# Patient Record
Sex: Male | Born: 1985 | Hispanic: No | Marital: Married | Smoking: Never smoker
Health system: Southern US, Community
[De-identification: ages and names within clinical notes are randomized; demographics above are authoritative.]

---

## 2019-07-18 ENCOUNTER — Other Ambulatory Visit (HOSPITAL_BASED_OUTPATIENT_CLINIC_OR_DEPARTMENT_OTHER): Payer: Self-pay | Admitting: Internal Medicine

## 2019-07-18 DIAGNOSIS — I272 Pulmonary hypertension, unspecified: Secondary | ICD-10-CM

## 2019-07-19 ENCOUNTER — Other Ambulatory Visit: Payer: Self-pay

## 2019-07-19 ENCOUNTER — Ambulatory Visit (HOSPITAL_BASED_OUTPATIENT_CLINIC_OR_DEPARTMENT_OTHER)
Admission: RE | Admit: 2019-07-19 | Discharge: 2019-07-19 | Disposition: A | Discharge status: Home / Self Care | Payer: 59 | Source: Ambulatory Visit | Attending: Internal Medicine | Admitting: Internal Medicine

## 2019-07-19 DIAGNOSIS — I272 Pulmonary hypertension, unspecified: Secondary | ICD-10-CM | POA: Insufficient documentation

## 2019-07-19 MED ORDER — IOHEXOL 300 MG/ML  SOLN
100.0000 mL | Freq: Once | INTRAMUSCULAR | Status: AC | PRN
  Administered 2019-07-19: 18:00:00 80 mL via INTRAVENOUS

## 2019-11-14 ENCOUNTER — Telehealth: Payer: Self-pay | Admitting: Emergency Medicine

## 2019-11-14 ENCOUNTER — Ambulatory Visit: Payer: Self-pay

## 2019-11-14 ENCOUNTER — Emergency Department (HOSPITAL_BASED_OUTPATIENT_CLINIC_OR_DEPARTMENT_OTHER)
Admission: EM | Admit: 2019-11-14 | Discharge: 2019-11-14 | Disposition: A | Discharge status: Home / Self Care | Payer: 59 | Attending: Emergency Medicine | Admitting: Emergency Medicine

## 2019-11-14 ENCOUNTER — Other Ambulatory Visit: Payer: Self-pay

## 2019-11-14 ENCOUNTER — Emergency Department (HOSPITAL_BASED_OUTPATIENT_CLINIC_OR_DEPARTMENT_OTHER): Payer: 59

## 2019-11-14 ENCOUNTER — Encounter (HOSPITAL_BASED_OUTPATIENT_CLINIC_OR_DEPARTMENT_OTHER): Payer: Self-pay | Admitting: Emergency Medicine

## 2019-11-14 DIAGNOSIS — R000 Tachycardia, unspecified: Secondary | ICD-10-CM | POA: Diagnosis present

## 2019-11-14 DIAGNOSIS — R519 Headache, unspecified: Secondary | ICD-10-CM | POA: Diagnosis not present

## 2019-11-14 DIAGNOSIS — M542 Cervicalgia: Secondary | ICD-10-CM | POA: Diagnosis not present

## 2019-11-14 DIAGNOSIS — R002 Palpitations: Secondary | ICD-10-CM | POA: Insufficient documentation

## 2019-11-14 LAB — CBC WITH DIFFERENTIAL/PLATELET
Abs Immature Granulocytes: 0.02 10*3/uL (ref 0.00–0.07)
Basophils Absolute: 0 10*3/uL (ref 0.0–0.1)
Basophils Relative: 0 %
Eosinophils Absolute: 0 10*3/uL (ref 0.0–0.5)
Eosinophils Relative: 0 %
HCT: 45.1 % (ref 39.0–52.0)
Hemoglobin: 14.5 g/dL (ref 13.0–17.0)
Immature Granulocytes: 0 %
Lymphocytes Relative: 16 %
Lymphs Abs: 1.4 10*3/uL (ref 0.7–4.0)
MCH: 29.2 pg (ref 26.0–34.0)
MCHC: 32.2 g/dL (ref 30.0–36.0)
MCV: 90.7 fL (ref 80.0–100.0)
Monocytes Absolute: 0.3 10*3/uL (ref 0.1–1.0)
Monocytes Relative: 4 %
Neutro Abs: 7.4 10*3/uL (ref 1.7–7.7)
Neutrophils Relative %: 80 %
Platelets: 227 10*3/uL (ref 150–400)
RBC: 4.97 MIL/uL (ref 4.22–5.81)
RDW: 12.7 % (ref 11.5–15.5)
WBC: 9.2 10*3/uL (ref 4.0–10.5)
nRBC: 0 % (ref 0.0–0.2)

## 2019-11-14 LAB — BASIC METABOLIC PANEL
Anion gap: 8 (ref 5–15)
BUN: 15 mg/dL (ref 6–20)
CO2: 26 mmol/L (ref 22–32)
Calcium: 9.2 mg/dL (ref 8.9–10.3)
Chloride: 104 mmol/L (ref 98–111)
Creatinine, Ser: 0.72 mg/dL (ref 0.61–1.24)
GFR calc Af Amer: >60 mL/min (ref >60)
GFR calc non Af Amer: >60 mL/min (ref >60)
Glucose, Bld: 117 mg/dL — ABNORMAL HIGH (ref 70–99)
Potassium: 4 mmol/L (ref 3.5–5.1)
Sodium: 138 mmol/L (ref 135–145)

## 2019-11-14 MED ORDER — SODIUM CHLORIDE 0.9 % IV BOLUS
1000.0000 mL | Freq: Once | INTRAVENOUS | Status: AC
  Administered 2019-11-14: 1000 mL via INTRAVENOUS

## 2019-11-14 NOTE — ED Notes (Signed)
Pt on monitor 

## 2019-11-14 NOTE — Telephone Encounter (Signed)
Message left for pt to call back to Urgent Care [phone])  regarding symptoms. Pt called back during note, pt stated he had a heart rate of 110-130 w/ night sweats, low temp 97, neck pain and headache. Based on symptoms after discussing with provider, pt is going to ER. Appointment scheduled for today will be canceled by [facility] staff.

## 2019-11-14 NOTE — ED Triage Notes (Signed)
Pt c/o tachycardia and diaphoresis since last night. Also reports neck stiffness and "tension to head" for "quite some time".

## 2019-11-14 NOTE — ED Provider Notes (Signed)
[facility] EMERGENCY DEPARTMENT Provider Note   CSN: [id number] Arrival date & time: 11/14/19  7026     History Chief Complaint  Patient presents with  . Tachycardia    James Vincent is a 34 y.o. male.  Patient with no significant medical history here with fast heart rate, headaches, for the last several weeks on and off.  Has had a lot of neck pain and stiffness.  Works a Research officer, trade union and sitting down.  Denies any obvious anxiety or stress.  Denies any chest pain, shortness of breath, abdominal pain.  Has seen ophthalmologist and was told he might have astigmatism.  Denies any active visual problems or headache or neck pain at this time.  Mostly concerned because he had a fast heart rate at home.  The history is provided by the patient.  Illness Severity:  Mild Onset quality:  Gradual Timing:  Intermittent Progression:  Waxing and waning Chronicity:  New Associated symptoms: headaches   Associated symptoms: no abdominal pain, no chest pain, no cough, no ear pain, no fever, no rash, no shortness of breath, no sore throat and no vomiting        History reviewed. No pertinent past medical history.  There are no problems to display for this patient.   History reviewed. No pertinent surgical history.     No family history on file.  Social History   Tobacco Use  . Smoking status: Never Smoker  . Smokeless tobacco: Never Used  Substance Use Topics  . Alcohol use: Not Currently  . Drug use: Never    Home Medications Prior to Admission medications   Not on File    Allergies    Patient has no known allergies.  Review of Systems   Review of Systems  Constitutional: Negative for chills and fever.  HENT: Negative for ear pain and sore throat.   Eyes: Negative for pain and visual disturbance.  Respiratory: Negative for cough and shortness of breath.   Cardiovascular: Negative for chest pain and palpitations.  Gastrointestinal: Negative for  abdominal pain and vomiting.  Genitourinary: Negative for dysuria and hematuria.  Musculoskeletal: Positive for neck pain. Negative for arthralgias and back pain.  Skin: Negative for color change and rash.  Neurological: Positive for headaches. Negative for seizures and syncope.  All other systems reviewed and are negative.   Physical Exam Updated Vital Signs  ED Triage Vitals  Enc Vitals Group     BP 11/14/19 0936 125/85     Pulse Rate 11/14/19 0936 82     Resp 11/14/19 0936 20     Temp 11/14/19 0936 99 F (37.2 C)     Temp Source 11/14/19 0936 Oral     SpO2 11/14/19 0936 100 %     Weight 11/14/19 0934 152 lb 1.9 oz (69 kg)     Height 11/14/19 0934 6' (1.829 m)     Head Circumference --      Peak Flow --      Pain Score 11/14/19 0934 0     Pain Loc --      Pain Edu? --      Excl. in GC? --     Physical Exam Vitals and nursing note reviewed.  Constitutional:      General: He is not in acute distress.    Appearance: He is well-developed. He is not ill-appearing.  HENT:     Head: Normocephalic and atraumatic.     Nose: Nose normal.  Mouth/Throat:     Mouth: Mucous membranes are moist.  Eyes:     Extraocular Movements: Extraocular movements intact.     Conjunctiva/sclera: Conjunctivae normal.     Pupils: Pupils are equal, round, and reactive to light.  Cardiovascular:     Rate and Rhythm: Normal rate and regular rhythm.     Pulses: Normal pulses.     Heart sounds: Normal heart sounds. No murmur heard.   Pulmonary:     Effort: Pulmonary effort is normal. No respiratory distress.     Breath sounds: Normal breath sounds.  Abdominal:     General: Abdomen is flat.     Palpations: Abdomen is soft.     Tenderness: There is no abdominal tenderness.  Musculoskeletal:        General: No tenderness.     Cervical back: Normal range of motion and neck supple. No tenderness.     Comments: No midline spinal tenderness  Skin:    General: Skin is warm and dry.    Neurological:     General: No focal deficit present.     Mental Status: He is alert and oriented to person, place, and time.     Cranial Nerves: No cranial nerve deficit.     Sensory: No sensory deficit.     Motor: No weakness.     Coordination: Coordination normal.     Comments: 5+ out of 5 strength throughout, normal sensation, no drift, normal finger-nose-finger, normal speech     ED Results / Procedures / Treatments   Labs (all labs ordered are listed, but only abnormal results are displayed) Labs Reviewed  BASIC METABOLIC PANEL - Abnormal; Notable for the following components:      Result Value   Glucose, Bld 117 (*)    All other components within normal limits  CBC WITH DIFFERENTIAL/PLATELET    EKG EKG Interpretation  Date/Time:  Thursday November 14 2019 09:42:26 EDT Ventricular Rate:  86 PR Interval:    QRS Duration: 93 QT Interval:  370 QTC Calculation: 443 R Axis:   87 Text Interpretation: Sinus rhythm Consider right atrial enlargement Confirmed by Virgina Norfolk [id number]) on 11/14/2019 10:11:59 AM   Radiology DG Chest Portable 1 View  Result Date: 11/14/2019 CLINICAL DATA:  Tachycardia EXAM: PORTABLE CHEST 1 VIEW COMPARISON:  July 19, 2019 FINDINGS: The cardiomediastinal silhouette is normal in contour. No pleural effusion. No pneumothorax. No acute pleuroparenchymal abnormality. Unchanged apical pleural thickening. Visualized abdomen is unremarkable. No acute osseous abnormality noted. IMPRESSION: No acute cardiopulmonary abnormality. Electronically Signed   By: Meda Klinefelter MD   On: 11/14/2019 10:28    Procedures Procedures (including critical care time)  Medications Ordered in ED Medications  sodium chloride 0.9 % bolus 1,000 mL (1,000 mLs Intravenous New Bag/Given 11/14/19 1114)    ED Course  I have reviewed the triage vital signs and the nursing notes.  Pertinent labs & imaging results that were available during my care of the patient were  reviewed by me and considered in my medical decision making (see chart for details).    MDM Rules/Calculators/A&P                          James Vincent is a 34 year old male who presents to the ED with tachycardia.  Patient with normal vitals.  No fever.  Normal heart rate.  EKG shows sinus rhythm.  No ischemic changes.  Patient with no significant anemia, electrolyte abnormality, kidney injury.  Chest x-ray without any signs of infection.  No pneumothorax.  Patient mostly concerned about a fast heart rate at home but that has now resolved.  He has had intermittent headaches, neck pain over the last several weeks.  Sits a lot at a desk and thinks may be related to that and looking at a computer a lot for work.  He has no current headache or neck pain now.  I suspect he might have some anxiety.  Could be muscle related/tension type pain in head and the neck.  Recently saw an ophthalmologist who told him that he had astigmatism that may be causing some of his symptoms as well.  Recommend follow-up with ophthalmology primary care doctor.  Did offer a head CT to further evaluate for any intracranial process but after discussion we held off.  He has a normal neurological exam.  No concern for intracranial process.  However may consider MRI with primary care doctor if symptoms persist.  Understands return precautions.  Discharged in good condition.  This chart was dictated using voice recognition software.  Despite best efforts to proofread,  errors can occur which can change the documentation meaning.    Final Clinical Impression(s) / ED Diagnoses Final diagnoses:  Palpitations    Rx / DC Orders ED Discharge Orders    None       Virgina Norfolk, DO 11/14/19 1208

## 2019-12-02 ENCOUNTER — Other Ambulatory Visit (HOSPITAL_BASED_OUTPATIENT_CLINIC_OR_DEPARTMENT_OTHER): Payer: Self-pay | Admitting: Internal Medicine

## 2019-12-02 DIAGNOSIS — R519 Headache, unspecified: Secondary | ICD-10-CM

## 2019-12-02 DIAGNOSIS — H538 Other visual disturbances: Secondary | ICD-10-CM

## 2019-12-07 ENCOUNTER — Ambulatory Visit (HOSPITAL_BASED_OUTPATIENT_CLINIC_OR_DEPARTMENT_OTHER)
Admission: RE | Admit: 2019-12-07 | Discharge: 2019-12-07 | Disposition: A | Discharge status: Home / Self Care | Payer: 59 | Source: Ambulatory Visit | Attending: Internal Medicine | Admitting: Internal Medicine

## 2019-12-07 ENCOUNTER — Other Ambulatory Visit: Payer: Self-pay

## 2019-12-07 DIAGNOSIS — R519 Headache, unspecified: Secondary | ICD-10-CM | POA: Diagnosis present

## 2019-12-07 DIAGNOSIS — H538 Other visual disturbances: Secondary | ICD-10-CM | POA: Insufficient documentation

## 2019-12-07 MED ORDER — GADOBUTROL 1 MMOL/ML IV SOLN
7.0000 mL | Freq: Once | INTRAVENOUS | Status: AC | PRN
  Administered 2019-12-07: 7 mL via INTRAVENOUS

## 2019-12-19 ENCOUNTER — Other Ambulatory Visit (HOSPITAL_BASED_OUTPATIENT_CLINIC_OR_DEPARTMENT_OTHER): Payer: Self-pay | Admitting: Internal Medicine

## 2019-12-19 DIAGNOSIS — M5136 Other intervertebral disc degeneration, lumbar region: Secondary | ICD-10-CM

## 2019-12-23 ENCOUNTER — Other Ambulatory Visit (HOSPITAL_BASED_OUTPATIENT_CLINIC_OR_DEPARTMENT_OTHER): Payer: Self-pay | Admitting: Internal Medicine

## 2019-12-23 DIAGNOSIS — M542 Cervicalgia: Secondary | ICD-10-CM

## 2019-12-24 ENCOUNTER — Other Ambulatory Visit: Payer: Self-pay

## 2019-12-24 ENCOUNTER — Ambulatory Visit (HOSPITAL_BASED_OUTPATIENT_CLINIC_OR_DEPARTMENT_OTHER)
Admission: RE | Admit: 2019-12-24 | Discharge: 2019-12-24 | Disposition: A | Discharge status: Home / Self Care | Payer: 59 | Source: Ambulatory Visit | Attending: Internal Medicine | Admitting: Internal Medicine

## 2019-12-24 DIAGNOSIS — M542 Cervicalgia: Secondary | ICD-10-CM | POA: Insufficient documentation

## 2020-02-03 ENCOUNTER — Other Ambulatory Visit: Payer: Self-pay

## 2020-02-03 ENCOUNTER — Encounter: Payer: Self-pay | Admitting: Student

## 2020-02-03 ENCOUNTER — Ambulatory Visit (INDEPENDENT_AMBULATORY_CARE_PROVIDER_SITE_OTHER): Payer: 59 | Admitting: Student

## 2020-02-03 VITALS — BP 116/81 | HR 85 | Temp 98.2°F | Ht 72.0 in | Wt 160.0 lb

## 2020-02-03 DIAGNOSIS — Z23 Encounter for immunization: Secondary | ICD-10-CM | POA: Diagnosis not present

## 2020-02-03 DIAGNOSIS — G44209 Tension-type headache, unspecified, not intractable: Secondary | ICD-10-CM

## 2020-02-03 DIAGNOSIS — R2689 Other abnormalities of gait and mobility: Secondary | ICD-10-CM | POA: Diagnosis not present

## 2020-02-03 NOTE — Patient Instructions (Addendum)
Thank you, [name] for allowing [place] to provide your care today. Today we discussed neck stiffness and feeling off balanced.  It seems as though you are having tension headaches! These can be terribly debilitating, I recommend taking ibuprofen for any discomfort, a heating pad during the day to help relax those muscles, and I will be referring you to physical therapy as well  I have ordered the following labs for you:  Lab Orders  No laboratory test(s) ordered today     Referrals ordered today:    Referral Orders     Ambulatory referral to Physical Therapy   I have ordered the following medication/changed the following medications:   Stop the following medications: There are no discontinued medications.   Start the following medications: No orders of the defined types were placed in this encounter.    Follow up: 3 months    Remember: To take breaks at work and stretch your neck!  Should you have any questions or concerns please call the internal medicine clinic at [phone].     James Vincent, D.O. [facility]   Tension Headache, Adult A tension headache is pain, pressure, or aching in your head. Tension headaches can last from 30 minutes to several days. Follow these instructions at home: Managing pain  Take over-the-counter and prescription medicines only as told by your doctor.  When you have a headache, lie down in a dark, quiet room.  If told, put ice on your head and neck: ? Put ice in a plastic bag. ? Place a towel between your skin and the bag. ? Leave the ice on for 20 minutes, 2-3 times a day.  If told, put heat on the back of your neck. Do this as often as your doctor tells you to. Use the kind of heat that your doctor recommends, such as a moist heat pack or a heating pad. ? Place a towel between your skin and the heat. ? Leave the heat on for 20-30 minutes. ? Remove the heat if your skin turns bright  red. Eating and drinking  Eat meals on a regular schedule.  Watch how much alcohol you drink: ? If you are a woman and are not pregnant, do not drink more than 1 drink a day. ? If you are a man, do not drink more than 2 drinks a day.  Drink enough fluid to keep your pee (urine) pale yellow.  Do not use a lot of caffeine, or stop using caffeine. Lifestyle  Get enough sleep. Get 7-9 hours of sleep each night. Or get the amount of sleep that your doctor tells you to.  At bedtime, remove all electronic devices from your room. Examples of electronic devices are computers, phones, and tablets.  Find ways to lessen your stress. Some things that can lessen stress are: ? Exercise. ? Deep breathing. ? Yoga. ? Music. ? Positive thoughts.  Sit up straight. Do not tighten (tense) your muscles.  Do not use any products that have nicotine or tobacco in them, such as cigarettes and e-cigarettes. If you need help quitting, ask your doctor. General instructions   Keep all follow-up visits as told by your doctor. This is important.  Avoid things that can bring on headaches. Keep a journal to find out if certain things bring on headaches. For example, write down: ? What you eat and drink. ? How much sleep you get. ? Any change to your diet or medicines. Contact a doctor if:  Your headache does not get better.  Your headache comes back.  You have a headache and sounds, light, or smells bother you.  You feel sick to your stomach (nauseous) or you throw up (vomit).  Your stomach hurts. Get help right away if:  You suddenly get a very bad headache along with any of these: ? A stiff neck. ? Feeling sick to your stomach. ? Throwing up. ? Feeling weak. ? Trouble seeing. ? Feeling short of breath. ? A rash. ? Feeling unusually sleepy. ? Trouble speaking. ? Pain in your eye or ear. ? Trouble walking or balancing. ? Feeling like you will pass out (faint). ? Passing out. Summary  A  tension headache is pain, pressure, or aching in your head.  Tension headaches can last from 30 minutes to several days.  Lifestyle changes and medicines may help relieve pain. This information is not intended to replace advice given to you by your health care provider. Make sure you discuss any questions you have with your health care provider. Document Revised: 01/02/2019 Document Reviewed: 06/17/2016 Elsevier Patient Education  2020 [employer].

## 2020-02-03 NOTE — Assessment & Plan Note (Signed)
Assessment: Patient endorses loss of balance for the past 5-6 months. When walking, he feels as though he needs to brace himself to not fall over. He however denies ever having fallen. He notices that it is worse out in public, but still endorses feeling off balance at home. It is better in the morning and worsens throughout the day. The patient notes it has gotten to the point where he does not like to go out in public, having fear that he will fall and hurt himself. He denies ever having this before. Denies dizziness or feeling as though the room is spinning. Denies ear pain. Does endorse some lower extremity weakness and questions whether or not his vitamin D needs to be supplemented or if he has a B12 deficiency. Reassured patient that his electrolytes and hemoglobin were unremarkable in August, prior to these symptoms beginning, which makes me less suspicious that this is a electrolyte or vitamin deficiency. The last labs were in August, after his symptoms onset.   On physical exam, patient's gait is unremarkable, without swaying or appearing off balance. The strength in all of his extremities is 5/5. Unremarkable neurological examination  While the tension headache he was diagnosed with today may be contributing, I believe that there is some anxiety to his loss of balance as well as deconditioning from lack of physical activity due to this fear of falling. He stated that at times he would ask his wife to run errands as he was nervous about falling. I believe that physical therapy for his tension headache and to assist him in his balance/gait will be best for the patient. If he continues to feel this way and if physical therapy assess the patient and do not believe there is true loss of balance, will consider cognitive behavioral therapy. Negative brain MRI and unremarkable labs is reassuring as well as inconsistency in symptoms; worse out in public and better at home.   Plan: Physical therapy referral  in place

## 2020-02-03 NOTE — Assessment & Plan Note (Addendum)
Assessment: Patient endorses neck pain/top of head discomfort for the past 4-5 months. He was evaluated by another PCP who ordered neck x-ray, brain MRI that were unremarkable. He notes the pain is worse in the middle of the day and improves in the evening and in the mornings. He notes the pain is worse with movements. Has tried icy hot without relief. 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 He tries to take breaks, but denies performing any neck stretches. Denies applying heat or warmth to the area.   On examination the patient's neck is not tender to palpate. He appears to have some tightness with rotation, he does however have the ability to perform full range of motion without limitations. His neurological exam was unremarkable.   The patient's history, physical exam findings, and MRI brain/cervical x-ray lead me to believe that his symptoms are due to tension headaches from his screen time while working and lack of physical activity/stretching. The patient has been cautious about the amount of physical activity he has been performing as he has felt off balanced. He appears nervous about falling and his hesitant to start any type of physical activity. Because of this, I believe conservative treatment and physical therapy referral would be the best course of action.  Plan: -Referral placed for physical therapy -Advised patient to take ibuprofen for any discomfort and purchase heating pad to use during the work day -Printed patient off home stretches in interim while he waits for PT referral

## 2020-02-03 NOTE — Progress Notes (Signed)
CC: Neck Stiffness  HPI:  [name] is a 34 y.o. male with a past medical history stated below and presents today for neck stiffness. Please see problem based assessment and plan for additional details.  No past medical history on file.  No current outpatient medications on file prior to visit.   No current facility-administered medications on file prior to visit.    No family history on file.  Social History   Socioeconomic History  . Marital status: Married    Spouse name: Not on file  . Number of children: Not on file  . Years of education: Not on file  . Highest education level: Not on file  Occupational History  . Not on file  Tobacco Use  . Smoking status: Never Smoker  . Smokeless tobacco: Never Used  Substance and Sexual Activity  . Alcohol use: Not Currently  . Drug use: Never  . Sexual activity: Not on file  Other Topics Concern  . Not on file  Social History Narrative  . Not on file   Social Determinants of Health   Financial Resource Strain:   . Difficulty of Paying Living Expenses: Not on file  Food Insecurity:   . Worried About Programme researcher, broadcasting/film/video in the Last Year: Not on file  . Ran Out of Food in the Last Year: Not on file  Transportation Needs:   . Lack of Transportation (Medical): Not on file  . Lack of Transportation (Non-Medical): Not on file  Physical Activity:   . Days of Exercise per Week: Not on file  . Minutes of Exercise per Session: Not on file  Stress:   . Feeling of Stress : Not on file  Social Connections:   . Frequency of Communication with Friends and Family: Not on file  . Frequency of Social Gatherings with Friends and Family: Not on file  . Attends Religious Services: Not on file  . Active Member of Clubs or Organizations: Not on file  . Attends Banker Meetings: Not on file  . Marital Status: Not on file  Intimate Partner Violence:   . Fear of Current or Ex-Partner: Not on file  . Emotionally  Abused: Not on file  . Physically Abused: Not on file  . Sexually Abused: Not on file    Review of Systems: ROS negative except for what is noted on the assessment and plan.  Vitals:   02/03/20 1020  BP: 116/81  Pulse: 85  Temp: 98.2 F (36.8 C)  TempSrc: Oral  Weight: 160 lb (72.6 kg)  Height: 6' (1.829 m)     Physical Exam: Physical Exam Vitals and nursing note reviewed.  Constitutional:      General: He is not in acute distress.    Appearance: Normal appearance. He is normal weight. He is not ill-appearing, toxic-appearing or diaphoretic.  HENT:     Head: Normocephalic and atraumatic.  Eyes:     Pupils: Pupils are equal, round, and reactive to light.  Cardiovascular:     Rate and Rhythm: Normal rate and regular rhythm.     Pulses: Normal pulses.     Heart sounds: Normal heart sounds. No murmur heard.  No friction rub. No gallop.   Pulmonary:     Effort: Pulmonary effort is normal. No respiratory distress.     Breath sounds: Normal breath sounds. No stridor. No wheezing, rhonchi or rales.  Abdominal:     General: There is no distension.  Musculoskeletal:  Cervical back: Normal range of motion and neck supple. No rigidity or tenderness.     Right lower leg: No edema.     Left lower leg: No edema.  Lymphadenopathy:     Cervical: No cervical adenopathy.  Skin:    General: Skin is warm and dry.  Neurological:     General: No focal deficit present.     Mental Status: He is alert and oriented to person, place, and time. Mental status is at baseline.     Cranial Nerves: No cranial nerve deficit.     Sensory: No sensory deficit.     Motor: No weakness.     Coordination: Coordination normal.     Gait: Gait normal.  Psychiatric:        Mood and Affect: Mood normal.        Behavior: Behavior normal.        Thought Content: Thought content normal.        Judgment: Judgment normal.      Assessment & Plan:   See Encounters Tab for problem based  charting.  Patient seen with Dr. Jolly Mango, D.O. [facility], PGY-1 Pager: [phone], Phone: [phone] Date 02/03/2020 Time 5:35 PM

## 2020-02-11 ENCOUNTER — Encounter: Payer: Self-pay | Admitting: Physical Therapy

## 2020-02-11 ENCOUNTER — Ambulatory Visit: Payer: 59 | Attending: Internal Medicine | Admitting: Physical Therapy

## 2020-02-11 ENCOUNTER — Other Ambulatory Visit: Payer: Self-pay

## 2020-02-11 DIAGNOSIS — M6281 Muscle weakness (generalized): Secondary | ICD-10-CM | POA: Insufficient documentation

## 2020-02-11 DIAGNOSIS — M542 Cervicalgia: Secondary | ICD-10-CM | POA: Diagnosis not present

## 2020-02-11 DIAGNOSIS — R293 Abnormal posture: Secondary | ICD-10-CM

## 2020-02-11 NOTE — Patient Instructions (Signed)
Access Code: [email or web address] URL: [email or web address] Date: 02/11/2020 Prepared by: Rosana Hoes  Exercises Seated Passive Cervical Retraction - 3-4 x daily - 7 x weekly - 10 reps - 5 seconds hold Banded Row - 1 x daily - 7 x weekly - 3 sets - 15 reps Shoulder External Rotation and Scapular Retraction with Resistance - 1 x daily - 7 x weekly - 3 sets - 15 reps

## 2020-02-11 NOTE — Therapy (Signed)
[facility] [address] [place], [place], [address] Phone: [phone]   Fax:  [phone]  Physical Therapy Evaluation  Patient Details  Name: Astor Gentle MRN: [id number] Date of Birth: 1985-11-21 Referring Provider (PT): Anne Shutter, MD   Encounter Date: 02/11/2020   PT End of Session - 02/11/20 1320    Visit Number 1    Number of Visits 8    Date for PT Re-Evaluation 04/07/20    Authorization Type UHC    PT Start Time 1130    PT Stop Time 1215    PT Time Calculation (min) 45 min    Activity Tolerance Patient tolerated treatment well    Behavior During Therapy [facility] for tasks assessed/performed           History reviewed. No pertinent past medical history.  History reviewed. No pertinent surgical history.  There were no vitals filed for this visit.    Subjective Assessment - 02/11/20 1132    Subjective Patient reports symptoms of stiff neck, pressure on top of his head, and off balance for the past 4-5 months. He reports he did have an episode where he hurt his back and had to rest for 2-3 days, and that may have triggered his current symptoms. Sometimes he has times where symptoms are better or worse. 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 Patient notes symptoms increase when he goes outside like to grocery stores, and they get worse toward the evening, where he feels better in the morning. Patient notes his leg don't feel strong. Sometimes when he goes for walks, if he keeps his neck straight and doesn't look around he feels more stable. Patient also notes left shoulder/upper arm pain for past 3 weeks.    Limitations Sitting;Standing;Walking;House hold activities    How long can you sit comfortably? 30-40 minutes    How long can you walk comfortably? 4000 steps per day    Diagnostic tests Cervical X-ray, Brain MRI    Patient Stated Goals  Improve posture with sitting and feeling of balance and strength with walking    Currently in Pain? No/denies    Pain Score 0-No pain    Pain Location Neck    Pain Orientation Right;Left    Pain Descriptors / Indicators Tightness;Pressure    Pain Type Chronic pain    Pain Radiating Towards Pressure on top of head (always occurs with neck sitffness)    Pain Onset More than a month ago    Pain Frequency Intermittent    Aggravating Factors  Sitting extended periods, turning hed    Pain Relieving Factors If he keeps his head straight while walking, postural changes    Effect of Pain on Daily Activities Patient limited with sitting at work and walking in community              [facility] PT Assessment - 02/11/20 0001      Assessment   Medical Diagnosis Tension headache    Referring Provider (PT) Anne Shutter, MD    Onset Date/Surgical Date --   4-5 month history   Hand Dominance Right    Next MD Visit Not scheduled    Prior Therapy None      Precautions   Precautions None      Restrictions   Weight Bearing Restrictions No      Balance Screen   Has the patient fallen in  the past 6 months No    Has the patient had a decrease in activity level because of a fear of falling?  Yes    Is the patient reluctant to leave their home because of a fear of falling?  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    Leisure None currently reported      Cognition   Overall Cognitive Status Within Functional Limits for tasks assessed      Observation/Other Assessments   Observations Patient appears in no apparent distress    Focus on Therapeutic Outcomes (FOTO)  59% functional status      Sensation   Light Touch Appears Intact      Coordination   Gross Motor Movements are Fluid and Coordinated Yes      Posture/Postural Control   Posture  Comments Patient exhibits rounded shoulder and forward posture, flattening of thoracic spine      ROM / Strength   AROM / PROM / Strength AROM;Strength      AROM   Overall AROM Comments Tension noted with cervical rotation, patient exhibits limitation with left shoulder abduction and functiional reach HBB and HBH    AROM Assessment Site Cervical    Cervical Flexion 45    Cervical Extension 40    Cervical - Right Side Bend 20    Cervical - Left Side Bend 30    Cervical - Right Rotation 75    Cervical - Left Rotation 75      Strength   Overall Strength Comments Periscapular strength grossly 4-/5 MMT    Strength Assessment Site Shoulder    Right/Left Shoulder Right;Left    Right Shoulder Flexion 4+/5    Right Shoulder ABduction 4+/5    Right Shoulder External Rotation 4+/5    Left Shoulder Flexion 4+/5    Left Shoulder ABduction 4+/5    Left Shoulder External Rotation 4/5      Palpation   Spinal mobility Cervical spine WFL and no concordant pain elicited    Palpation comment Non TTP      Special Tests   Other special tests All radicular testing negative      Transfers   Transfers Independent with all Transfers                      Objective measurements completed on examination: See above findings.       [facility] Adult PT Treatment/Exercise - 02/11/20 0001      Exercises   Exercises Neck      Neck Exercises: Theraband   Rows 15 reps;Red    Shoulder External Rotation 15 reps;Red    Shoulder External Rotation Limitations double er + scap retraction      Neck Exercises: Seated   Neck Retraction 10 reps;5 secs    Neck Retraction Limitations used hand on chin for assist      Neck Exercises: Stretches   Lower Cervical/Upper Thoracic Stretch Limitations Trialed child's pose and sidelying thoracic stretch but patient reported left shoulder discomfort                  PT Education - 02/11/20 1320    Education Details Exam findings, posture, HEP, POC     Person(s) Educated Patient    Methods Explanation;Demonstration;Tactile cues;Verbal cues;Handout    Comprehension Verbalized understanding;Returned  demonstration;Verbal cues required;Tactile cues required;Need further instruction            PT Short Term Goals - 02/11/20 1321      PT SHORT TERM GOAL #1   Title Patient will be I with initial HEP to progress with PT    Time 4    Period Weeks    Status New    Target Date 03/10/20      PT SHORT TERM GOAL #2   Title PT with review FOTO with patient by 3rd visit    Time 4    Period Weeks    Status New    Target Date 03/10/20             PT Long Term Goals - 02/11/20 1323      PT LONG TERM GOAL #1   Title Patient will be I with final HEP to maintain progress from PT    Time 8    Period Weeks    Status New    Target Date 04/07/20      PT LONG TERM GOAL #2   Title Patient will report improved functional status to >/= 74% on FOTO    Time 8    Period Weeks    Status New    Target Date 04/07/20      PT LONG TERM GOAL #3   Title Patient will report no tightness or pain with cervical rotation to improve ability to turn head while driving    Time 8    Period Weeks    Status New    Target Date 04/07/20      PT LONG TERM GOAL #4   Title Patient will exhibit improved periscapular muscle strenth to >/= 4+/5 MMT to improve postural control and sitting tolerance    Time 8    Period Weeks    Status New    Target Date 04/07/20      PT LONG TERM GOAL #5   Title Patient will demonstrate improve DNF endurance to >/= 30 sec to improve postural control and reduce neck stiffness and head pressure    Time 8    Period Weeks    Status New    Target Date 04/07/20      Additional Long Term Goals   Additional Long Term Goals Yes      PT LONG TERM GOAL #6   Title Patient will report no instances of inbalance with walking in community to improve ability to exercise    Time 8    Period Weeks    Status New    Target Date  04/07/20                  Plan - 02/11/20 1333    Clinical Impression Statement Patient presents to PT with report of chronic neck stiffness and pressure on the top of his head. He also notes feeling of balance deficit and weakness when walking in community. His neck symptoms seem musculoskeletal in nature as he demonstrates slight limitation in cervical motion with report of muscular tightness, postural deficits with decreased periscapular strength, poor DNF endurance, and no radicular symptoms. He did not have any positive vestibular or occulomotor testing this visit to casue his feel of balance impairment. Will further assess balance in future visit. Patient was provided exercises to initiate postural control and he would benefit from continued skilled PT to progress his strength and mobility in order to reduce pain or limitation and maximize functional ability.  Personal Factors and Comorbidities Fitness;Time since onset of injury/illness/exacerbation    Examination-Activity Limitations Locomotion Level;Sit    Examination-Participation Restrictions Occupation;Community Activity;Shop    Stability/Clinical Decision Making Stable/Uncomplicated    Clinical Decision Making Low    Rehab Potential Good    PT Frequency 1x / week    PT Duration 8 weeks    PT Treatment/Interventions ADLs/Self Care Home Management;Cryotherapy;Electrical Stimulation;Iontophoresis 4mg /ml Dexamethasone;Moist Heat;Traction;Ultrasound;Neuromuscular re-education;Balance training;Therapeutic exercise;Therapeutic activities;Functional mobility training;Stair training;Gait training;Patient/family education;Manual techniques;Dry needling;Passive range of motion;Taping;Spinal Manipulations;Joint Manipulations    PT Next Visit Plan Review HEP and progress PRN, continue postural strengthening, incorporate thoracic mobility and core strengthening; further balance assessment and exercise PRN    PT Home Exercise Plan [id number]:  chin tuck with assist, row, er + scap retraction with band    Consulted and Agree with Plan of Care Patient           Patient will benefit from skilled therapeutic intervention in order to improve the following deficits and impairments:  Decreased range of motion, Postural dysfunction, Decreased strength, Pain, Decreased activity tolerance, Decreased balance  Visit Diagnosis: Cervicalgia  Abnormal posture  Muscle weakness (generalized)     Problem List Patient Active Problem List   Diagnosis Date Noted  . Tension headache 02/03/2020  . Loss of balance 02/03/2020    Rosana Hoesampbell Jameca Chumley, PT, DPT, LAT, ATC 02/11/20  1:47 PM Phone: [phone] Fax: [phone]   [facility] [address] [place], [place], [address] Phone: [phone]   Fax:  [phone]  Name: Wilford Gristhmet Lundblad MRN: [id number] Date of Birth: 12/02/1985

## 2020-02-15 NOTE — Progress Notes (Signed)
Internal Medicine Clinic Attending  I saw and evaluated the patient.  I personally confirmed the key portions of the history and exam documented by Dr. Katsadouros and I reviewed pertinent patient test results.  The assessment, diagnosis, and plan were formulated together and I agree with the documentation in the resident's note.  Donya Hitch, M.D., Ph.D.  

## 2020-02-21 ENCOUNTER — Ambulatory Visit: Payer: 59 | Attending: Internal Medicine | Admitting: Physical Therapy

## 2020-02-21 ENCOUNTER — Encounter: Payer: Self-pay | Admitting: Physical Therapy

## 2020-02-21 ENCOUNTER — Other Ambulatory Visit: Payer: Self-pay

## 2020-02-21 DIAGNOSIS — R293 Abnormal posture: Secondary | ICD-10-CM | POA: Diagnosis present

## 2020-02-21 DIAGNOSIS — M542 Cervicalgia: Secondary | ICD-10-CM | POA: Diagnosis not present

## 2020-02-21 DIAGNOSIS — M6281 Muscle weakness (generalized): Secondary | ICD-10-CM | POA: Insufficient documentation

## 2020-02-21 NOTE — Therapy (Signed)
[facility] [address] [place], [place], [address] Phone: [phone]   Fax:  [phone]  Physical Therapy Treatment  Patient Details  Name: James Vincent MRN: [id number] Date of Birth: 05-24-1985 Referring Provider (PT): Anne Shutter, MD   Encounter Date: 02/21/2020   PT End of Session - 02/21/20 1019    Visit Number 2    Number of Visits 8    Date for PT Re-Evaluation 04/07/20    Authorization Type UHC    PT Start Time 1019    PT Stop Time 1100    PT Time Calculation (min) 41 min           History reviewed. No pertinent past medical history.  History reviewed. No pertinent surgical history.  There were no vitals filed for this visit.   Subjective Assessment - 02/21/20 1019    Subjective No pain now. Doing the exercises. Maybe a little better.    Currently in Pain? No/denies              [facility] PT Assessment - 02/21/20 0001      Standardized Balance Assessment   Standardized Balance Assessment Berg Balance Test      Berg Balance Test   Sit to Stand Able to stand without using hands and stabilize independently    Standing Unsupported Able to stand safely 2 minutes    Sitting with Back Unsupported but Feet Supported on Floor or Stool Able to sit safely and securely 2 minutes    Stand to Sit Sits safely with minimal use of hands    Transfers Able to transfer safely, minor use of hands    Standing Unsupported with Eyes Closed Able to stand 10 seconds safely    Standing Unsupported with Feet Together Able to place feet together independently and stand 1 minute safely    From Standing, Reach Forward with Outstretched Arm Can reach confidently >25 cm (10")    From Standing Position, Pick up Object from Floor Able to pick up shoe safely and easily    From Standing Position, Turn to Look Behind Over each Shoulder Looks behind from both sides and weight shifts well    Turn 360 Degrees Able to turn 360  degrees safely in 4 seconds or less    Standing Unsupported, Alternately Place Feet on Step/Stool Able to stand independently and safely and complete 8 steps in 20 seconds    Standing Unsupported, One Foot in Front Able to place foot tandem independently and hold 30 seconds    Standing on One Leg Able to lift leg independently and hold > 10 seconds    Total Score 56                         [facility] Adult PT Treatment/Exercise - 02/21/20 0001      Neck Exercises: Machines for Strengthening   UBE (Upper Arm Bike) L2 posterior with cues for posture      Neck Exercises: Theraband   Rows 20 reps    Rows Limitations green    Shoulder External Rotation 20 reps    Shoulder External Rotation Limitations double er+scap retraction      Neck Exercises: Seated   Neck Retraction 10 reps;5 secs      Neck Exercises: Stretches   Lower Cervical/Upper Thoracic Stretch Limitations open book with hand on chest to reduce shoulder pain    Other Neck Stretches Seated thoracic extension in chair hands behind  head and chin tucked- , also thoracic rotation using chair for assist.                     PT Short Term Goals - 02/11/20 1321      PT SHORT TERM GOAL #1   Title Patient will be I with initial HEP to progress with PT    Time 4    Period Weeks    Status New    Target Date 03/10/20      PT SHORT TERM GOAL #2   Title PT with review FOTO with patient by 3rd visit    Time 4    Period Weeks    Status New    Target Date 03/10/20             PT Long Term Goals - 02/11/20 1323      PT LONG TERM GOAL #1   Title Patient will be I with final HEP to maintain progress from PT    Time 8    Period Weeks    Status New    Target Date 04/07/20      PT LONG TERM GOAL #2   Title Patient will report improved functional status to >/= 74% on FOTO    Time 8    Period Weeks    Status New    Target Date 04/07/20      PT LONG TERM GOAL #3   Title Patient will report no tightness  or pain with cervical rotation to improve ability to turn head while driving    Time 8    Period Weeks    Status New    Target Date 04/07/20      PT LONG TERM GOAL #4   Title Patient will exhibit improved periscapular muscle strenth to >/= 4+/5 MMT to improve postural control and sitting tolerance    Time 8    Period Weeks    Status New    Target Date 04/07/20      PT LONG TERM GOAL #5   Title Patient will demonstrate improve DNF endurance to >/= 30 sec to improve postural control and reduce neck stiffness and head pressure    Time 8    Period Weeks    Status New    Target Date 04/07/20      Additional Long Term Goals   Additional Long Term Goals Yes      PT LONG TERM GOAL #6   Title Patient will report no instances of inbalance with walking in community to improve ability to exercise    Time 8    Period Weeks    Status New    Target Date 04/07/20                 Plan - 02/21/20 1116    Clinical Impression Statement Pt reports maybe a little better and he is compliant with HEP. BERG 56/56, SLS 20 sec right, 17 sec left. Reviewed HEP and progressed with thoracic rotation and extension stretches. Added upper trap and levaor stretches all with good tolerance.    PT Next Visit Plan Review HEP and progress PRN, continue postural strengthening, incorporate thoracic mobility and core strengthening; further balance assessment and exercise PRN    PT Home Exercise Plan [id number]: chin tuck with assist, row, er + scap retraction with band, added upper trap stretch, levaor stretch, thoracic extension seated and open books (modified left hand on chest)  Patient will benefit from skilled therapeutic intervention in order to improve the following deficits and impairments:  Decreased range of motion, Postural dysfunction, Decreased strength, Pain, Decreased activity tolerance, Decreased balance  Visit Diagnosis: Cervicalgia  Abnormal posture  Muscle weakness  (generalized)     Problem List Patient Active Problem List   Diagnosis Date Noted   Tension headache 02/03/2020   Loss of balance 02/03/2020    Sherrie Mustache , PTA 02/21/2020, 11:20 AM  [facility] [address] [place], [place], [address] Phone: [phone]   Fax:  [phone]  Name: James Vincent MRN: [id number] Date of Birth: 28-Sep-1985

## 2020-02-28 ENCOUNTER — Other Ambulatory Visit: Payer: Self-pay

## 2020-02-28 ENCOUNTER — Encounter: Payer: Self-pay | Admitting: Physical Therapy

## 2020-02-28 ENCOUNTER — Ambulatory Visit: Payer: 59 | Admitting: Physical Therapy

## 2020-02-28 DIAGNOSIS — M6281 Muscle weakness (generalized): Secondary | ICD-10-CM

## 2020-02-28 DIAGNOSIS — R293 Abnormal posture: Secondary | ICD-10-CM

## 2020-02-28 DIAGNOSIS — M542 Cervicalgia: Secondary | ICD-10-CM

## 2020-02-28 NOTE — Therapy (Signed)
[place], [place], [address] Phone: [phone]   Fax:  [phone]  Physical Therapy Treatment  Patient Details  Name: Karsyn Rochin MRN: [id number] Date of Birth: 04-29-1985 Referring Provider (PT): Oda Kilts, MD   Encounter Date: 02/28/2020   PT End of Session - 02/28/20 1033    Visit Number 2    Number of Visits 8    Date for PT Re-Evaluation 04/07/20    Authorization Type UHC    PT Start Time 1017    PT Stop Time 1057    PT Time Calculation (min) 40 min           History reviewed. No pertinent past medical history.  History reviewed. No pertinent surgical history.  There were no vitals filed for this visit.   Subjective Assessment - 02/28/20 1018    Subjective Pt reports compliance with HEP and continued symptoms but they are not as intense. He feels improvement.    Currently in Pain? No/denies              [facility] PT Assessment - 02/28/20 0001      AROM   Cervical Flexion 45    Cervical Extension 45    Cervical - Right Side Bend 35    Cervical - Left Side Bend 30    Cervical - Right Rotation 75    Cervical - Left Rotation 75      Strength   Left Shoulder ABduction 4+/5    Left Shoulder External Rotation 4/5                         [facility] Adult PT Treatment/Exercise - 02/28/20 0001      Neck Exercises: Theraband   Shoulder Extension 10 reps    Shoulder Extension Limitations blue    Rows 20 reps    Rows Limitations blue    Shoulder External Rotation 20 reps;Green    Shoulder External Rotation Limitations double er+scap retraction    Horizontal ABduction 10 reps;Green    Horizontal ABduction Limitations 2 sets      Neck Exercises: Standing   Other Standing Exercises lower trap setting -bilat wall slides with lift off x 10      Neck Exercises: Seated   Neck Retraction 10 reps;5 secs      Neck Exercises: Stretches   Upper Trapezius Stretch 3  reps   added hand for over pressure   Levator Stretch 3 reps   added hand for overpressure   Other Neck Stretches seated thoracic extension strech 10 sec x 10                  PT Education - 02/28/20 1051    Education Details FOTO score and prediction reviewed with patient, HEP    Person(s) Educated Patient    Methods Explanation;Handout    Comprehension Verbalized understanding            PT Short Term Goals - 02/28/20 1050      PT SHORT TERM GOAL #1   Title Patient will be I with initial HEP to progress with PT    Time 4    Period Weeks    Status Achieved    Target Date 03/10/20      PT SHORT TERM GOAL #2   Title PT with review FOTO with patient by 3rd visit    Time 4    Period Weeks  Status Achieved             PT Long Term Goals - 02/11/20 1323      PT LONG TERM GOAL #1   Title Patient will be I with final HEP to maintain progress from PT    Time 8    Period Weeks    Status New    Target Date 04/07/20      PT LONG TERM GOAL #2   Title Patient will report improved functional status to >/= 74% on FOTO    Time 8    Period Weeks    Status New    Target Date 04/07/20      PT LONG TERM GOAL #3   Title Patient will report no tightness or pain with cervical rotation to improve ability to turn head while driving    Time 8    Period Weeks    Status New    Target Date 04/07/20      PT LONG TERM GOAL #4   Title Patient will exhibit improved periscapular muscle strenth to >/= 4+/5 MMT to improve postural control and sitting tolerance    Time 8    Period Weeks    Status New    Target Date 04/07/20      PT LONG TERM GOAL #5   Title Patient will demonstrate improve DNF endurance to >/= 30 sec to improve postural control and reduce neck stiffness and head pressure    Time 8    Period Weeks    Status New    Target Date 04/07/20      Additional Long Term Goals   Additional Long Term Goals Yes      PT LONG TERM GOAL #6   Title Patient will report  no instances of inbalance with walking in community to improve ability to exercise    Time 8    Period Weeks    Status New    Target Date 04/07/20                 Plan - 02/28/20 1019    Clinical Impression Statement Pt repots intermittent pulling in back of neck with cervical rotation and pressure that builds up in top of head 3-4 times a week. He reports difficulty reaching laterally to reach ATM. He reports improved comfort with looking around while walking, he feels more balanced. Cervical ROM improved.  Session focused on review and  progressing resistance with current HEP. Also added standing scap strength. He tolerated session well. Reviewed FOTO predictions with pt and he is indpendent with current HEP. Both STGS met.    PT Next Visit Plan Review HEP and progress PRN, continue postural strengthening, incorporate thoracic mobility and core strengthening; further balance assessment and exercise PRN    PT Home Exercise Plan [id number]: chin tuck with assist, row, er + scap retraction with band, added upper trap stretch, levaor stretch, thoracic extension seated and open books (modified left hand on chest)           Patient will benefit from skilled therapeutic intervention in order to improve the following deficits and impairments:  Decreased range of motion,Postural dysfunction,Decreased strength,Pain,Decreased activity tolerance,Decreased balance  Visit Diagnosis: Cervicalgia  Abnormal posture  Muscle weakness (generalized)     Problem List Patient Active Problem List   Diagnosis Date Noted  . Tension headache 02/03/2020  . Loss of balance 02/03/2020    Dorene Ar, PTA 02/28/2020, 10:59 AM  [facility]  [place], [place], [address] Phone: [phone]   Fax:  [phone]  Name: Tymeer Vaquera MRN: [id number] Date of Birth: 1985/12/13

## 2020-03-06 ENCOUNTER — Encounter: Payer: Self-pay | Admitting: Physical Therapy

## 2020-03-06 ENCOUNTER — Other Ambulatory Visit: Payer: Self-pay

## 2020-03-06 ENCOUNTER — Ambulatory Visit: Payer: 59 | Admitting: Physical Therapy

## 2020-03-06 DIAGNOSIS — M6281 Muscle weakness (generalized): Secondary | ICD-10-CM

## 2020-03-06 DIAGNOSIS — M542 Cervicalgia: Secondary | ICD-10-CM | POA: Diagnosis not present

## 2020-03-06 DIAGNOSIS — R293 Abnormal posture: Secondary | ICD-10-CM

## 2020-03-06 NOTE — Therapy (Signed)
[facility] Outpatient Rehabilitation [facility] [address] [place], [place], [address] Phone: [phone]   Fax:  [phone]  Physical Therapy Treatment  Patient Details  Name: James Vincent MRN: [id number] Date of Birth: 1985-06-15 Referring Provider (PT): Anne Shutter, MD   Encounter Date: 03/06/2020   PT End of Session - 03/06/20 1108    Visit Number 4    Number of Visits 8    Date for PT Re-Evaluation 04/07/20    Authorization Type UHC    PT Start Time 1047    PT Stop Time 1127    PT Time Calculation (min) 40 min    Activity Tolerance Patient tolerated treatment well    Behavior During Therapy [facility] for tasks assessed/performed           History reviewed. No pertinent past medical history.  History reviewed. No pertinent surgical history.  There were no vitals filed for this visit.   Subjective Assessment - 03/06/20 1051    Subjective Patient reports he can feel some improvement and feels more comfortable while looking around while walking. He still has symptoms of stiffness and tension at top of his head, feels the intensity has gotten better. Unclear what exacerbates symptoms, possibly sitting a lot can contribute to symptoms.    Patient Stated Goals Improve posture with sitting and feeling of balance and strength with walking    Currently in Pain? No/denies    Pain Location Neck    Pain Type Chronic pain    Pain Radiating Towards Tension on top of head (always occurs with neck sitffness)    Pain Onset More than a month ago    Pain Frequency Intermittent                             [facility] Adult PT Treatment/Exercise - 03/06/20 0001      Exercises   Exercises Neck      Neck Exercises: Machines for Strengthening   UBE (Upper Arm Bike) L3 x 4 min (2 fwd/bwd)      Neck Exercises: Theraband   Shoulder Extension 15 reps;Green   2 sets   Rows 15 reps;Blue   2 sets   Shoulder External Rotation 20 reps;Blue     Shoulder External Rotation Limitations double er+scap retraction    Horizontal ABduction 10 reps;Red   2 sets   Horizontal ABduction Limitations maintaining chin tuck throughout      Neck Exercises: Seated   Neck Retraction 10 reps;3 secs   2 sets   Neck Retraction Limitations red band resistance      Neck Exercises: Prone   Other Prone Exercise Quadruped serratus press 2 x 10   maintaining gentle chin tuck     Neck Exercises: Stretches   Levator Stretch 2 reps;20 seconds    Other Neck Stretches Seated thoracic extension strech 10 x 10 sec                  PT Education - 03/06/20 1107    Education Details HEP update    Person(s) Educated Patient    Methods Explanation;Handout;Demonstration;Tactile cues;Verbal cues    Comprehension Verbalized understanding;Need further instruction;Returned demonstration;Verbal cues required;Tactile cues required            PT Short Term Goals - 02/28/20 1050      PT SHORT TERM GOAL #1   Title Patient will be I with initial HEP to progress with PT  Time 4    Period Weeks    Status Achieved    Target Date 03/10/20      PT SHORT TERM GOAL #2   Title PT with review FOTO with patient by 3rd visit    Time 4    Period Weeks    Status Achieved             PT Long Term Goals - 02/11/20 1323      PT LONG TERM GOAL #1   Title Patient will be I with final HEP to maintain progress from PT    Time 8    Period Weeks    Status New    Target Date 04/07/20      PT LONG TERM GOAL #2   Title Patient will report improved functional status to >/= 74% on FOTO    Time 8    Period Weeks    Status New    Target Date 04/07/20      PT LONG TERM GOAL #3   Title Patient will report no tightness or pain with cervical rotation to improve ability to turn head while driving    Time 8    Period Weeks    Status New    Target Date 04/07/20      PT LONG TERM GOAL #4   Title Patient will exhibit improved periscapular muscle strenth to >/=  4+/5 MMT to improve postural control and sitting tolerance    Time 8    Period Weeks    Status New    Target Date 04/07/20      PT LONG TERM GOAL #5   Title Patient will demonstrate improve DNF endurance to >/= 30 sec to improve postural control and reduce neck stiffness and head pressure    Time 8    Period Weeks    Status New    Target Date 04/07/20      Additional Long Term Goals   Additional Long Term Goals Yes      PT LONG TERM GOAL #6   Title Patient will report no instances of inbalance with walking in community to improve ability to exercise    Time 8    Period Weeks    Status New    Target Date 04/07/20                 Plan - 03/06/20 1127    Clinical Impression Statement Patient tolerated therapy well with no adverse effects. Was able to progress DNF strengthening and postural strengthening with good tolerance. No pain reported with therapy and patient reports he feels he continues to improve. HEP updated this visit. He would benefit from continued skilled PT to progress his strength and mobility in order to reduce pain or limitation and maximize functional ability.    PT Treatment/Interventions ADLs/Self Care Home Management;Cryotherapy;Electrical Stimulation;Iontophoresis 4mg /ml Dexamethasone;Moist Heat;Traction;Ultrasound;Neuromuscular re-education;Balance training;Therapeutic exercise;Therapeutic activities;Functional mobility training;Stair training;Gait training;Patient/family education;Manual techniques;Dry needling;Passive range of motion;Taping;Spinal Manipulations;Joint Manipulations    PT Next Visit Plan Review HEP and progress PRN, continue postural strengthening, incorporate thoracic mobility and core strengthening; further balance assessment and exercise PRN    PT Home Exercise Plan    Consulted and Agree with Plan of Care Patient           Patient will benefit from skilled therapeutic intervention in order to improve the following deficits  and impairments:  Decreased range of motion,Postural dysfunction,Decreased strength,Pain,Decreased activity tolerance,Decreased balance  Visit Diagnosis: Cervicalgia  Abnormal posture  Muscle  weakness (generalized)     Problem List Patient Active Problem List   Diagnosis Date Noted   Tension headache 02/03/2020   Loss of balance 02/03/2020    Rosana Hoes, PT, DPT, LAT, ATC 03/06/20  11:30 AM Phone: [phone] Fax: [phone]   [facility] Outpatient Rehabilitation [facility] [address] [place], [place], [address] Phone: [phone]   Fax:  [phone]  Name: Neno Hohensee MRN: [id number] Date of Birth: 1985/07/31

## 2020-03-06 NOTE — Patient Instructions (Signed)
Access Code: [email or web address] URL: [email or web address] Date: 03/06/2020 Prepared by: Rosana Hoes  Exercises Seated Cervical Retraction - 3-4 x daily - 7 x weekly - 10 reps - 5 seconds hold Seated Upper Trapezius Stretch - 2 x daily - 7 x weekly - 2 reps - 20 hold Gentle Levator Scapulae Stretch - 2 x daily - 7 x weekly - 2 reps - 20 hold Seated Thoracic Extension with Hands Behind Neck - 2 x daily - 7 x weekly - 10 reps - 5 hold Sidelying Thoracic Rotation with Open Book - 2 x daily - 7 x weekly - 10 reps - 5 hold Cervical Retraction with Resistance - 1 x daily - 7 x weekly - 2 sets - 10 reps - 3 seconds hold Banded Row - 1 x daily - 7 x weekly - 3 sets - 15 reps Shoulder External Rotation and Scapular Retraction with Resistance - 1 x daily - 7 x weekly - 3 sets - 15 reps Standing Shoulder Horizontal Abduction with Resistance - 1 x daily - 7 x weekly - 3 sets - 10 reps Quadruped Scapular Protraction and Retraction - 1 x daily - 7 x weekly - 3 sets - 10 reps

## 2020-03-12 ENCOUNTER — Other Ambulatory Visit: Payer: Self-pay

## 2020-03-12 ENCOUNTER — Ambulatory Visit: Payer: 59 | Admitting: Physical Therapy

## 2020-03-12 ENCOUNTER — Encounter: Payer: Self-pay | Admitting: Physical Therapy

## 2020-03-12 DIAGNOSIS — M542 Cervicalgia: Secondary | ICD-10-CM | POA: Diagnosis not present

## 2020-03-12 DIAGNOSIS — R293 Abnormal posture: Secondary | ICD-10-CM

## 2020-03-12 DIAGNOSIS — M6281 Muscle weakness (generalized): Secondary | ICD-10-CM

## 2020-03-12 NOTE — Patient Instructions (Signed)
Access Code: [email or web address] URL: [email or web address] Date: 03/12/2020 Prepared by: Rosana Hoes  Exercises Supine Deep Neck Flexor Training - Repetitions - 3-4 x daily - 7 x weekly - 10 reps - as long as your can hold Seated Upper Trapezius Stretch - 2 x daily - 7 x weekly - 2 reps - 20 hold Gentle Levator Scapulae Stretch - 2 x daily - 7 x weekly - 2 reps - 20 hold Seated Thoracic Extension with Hands Behind Neck - 2 x daily - 7 x weekly - 10 reps - 5 hold Sidelying Thoracic Rotation with Open Book - 2 x daily - 7 x weekly - 10 reps - 5 hold Cervical Retraction with Resistance - 1 x daily - 7 x weekly - 2 sets - 10 reps - 3 seconds hold Banded Row - 1 x daily - 7 x weekly - 3 sets - 15 reps Shoulder External Rotation and Scapular Retraction with Resistance - 1 x daily - 7 x weekly - 3 sets - 15 reps Standing Shoulder Horizontal Abduction with Resistance - 1 x daily - 7 x weekly - 3 sets - 10 reps Quadruped Scapular Protraction and Retraction - 1 x daily - 7 x weekly - 3 sets - 10 reps

## 2020-03-12 NOTE — Therapy (Signed)
[facility] Outpatient Rehabilitation [facility] [address] [place], [place], [address] Phone: [phone]   Fax:  [phone]  Physical Therapy Treatment  Patient Details  Name: James Vincent MRN: [id number] Date of Birth: [birth year] Referring Provider (PT): Anne Shutter, MD   Encounter Date: 03/12/2020   PT End of Session - 03/12/20 1004    Visit Number 5    Number of Visits 8    Date for PT Re-Evaluation 04/07/20    Authorization Type UHC    PT Start Time 0958    PT Stop Time 1040    PT Time Calculation (min) 42 min    Activity Tolerance Patient tolerated treatment well    Behavior During Therapy [facility] for tasks assessed/performed           History reviewed. No pertinent past medical history.  History reviewed. No pertinent surgical history.  There were no vitals filed for this visit.   Subjective Assessment - 03/12/20 1003    Subjective Patient reports he is doing well with no new issues.    Patient Stated Goals Improve posture with sitting and feeling of balance and strength with walking    Currently in Pain? No/denies              [facility] PT Assessment - 03/12/20 0001      Observation/Other Assessments   Focus on Therapeutic Outcomes (FOTO)  73% functional status                         [facility] Adult PT Treatment/Exercise - 03/12/20 0001      Self-Care   Self-Care Other Self-Care Comments    Other Self-Care Comments  FOTO      Exercises   Exercises Neck      Neck Exercises: Machines for Strengthening   UBE (Upper Arm Bike) L3 x 4 min (2 fwd/bwd)    Cybex Row 20# 2 x 15    Lat Pull 15# 2 x 15   standing     Neck Exercises: Seated   Neck Retraction 10 reps;5 secs    Neck Retraction Limitations red band resistance      Neck Exercises: Supine   Neck Retraction 10 reps   2 sets   Neck Retraction Limitations chin tuck with lift, hold until fatigue      Neck Exercises: Prone   Other Prone Exercise Quadruped  serratus press 2 x 10   2nd set with red band     Neck Exercises: Stretches   Other Neck Stretches Sidelying thoracic rotation 10 x 10 sec                  PT Education - 03/12/20 1003    Education Details HEP    Person(s) Educated Patient    Methods Explanation;Handout;Demonstration;Verbal cues    Comprehension Verbalized understanding;Need further instruction;Returned demonstration;Verbal cues required            PT Short Term Goals - 02/28/20 1050      PT SHORT TERM GOAL #1   Title Patient will be I with initial HEP to progress with PT    Time 4    Period Weeks    Status Achieved    Target Date 03/10/20      PT SHORT TERM GOAL #2   Title PT with review FOTO with patient by 3rd visit    Time 4    Period Weeks    Status Achieved  PT Long Term Goals - 03/12/20 1013      PT LONG TERM GOAL #1   Title Patient will be I with final HEP to maintain progress from PT    Time 8    Period Weeks    Status On-going    Target Date 04/07/20      PT LONG TERM GOAL #2   Title Patient will report improved functional status to >/= 74% on FOTO    Time 8    Period Weeks    Status On-going    Target Date 04/07/20      PT LONG TERM GOAL #3   Title Patient will report no tightness or pain with cervical rotation to improve ability to turn head while driving    Time 8    Period Weeks    Status New    Target Date 04/07/20      PT LONG TERM GOAL #4   Title Patient will exhibit improved periscapular muscle strenth to >/= 4+/5 MMT to improve postural control and sitting tolerance    Time 8    Period Weeks    Status New    Target Date 04/07/20      PT LONG TERM GOAL #5   Title Patient will demonstrate improve DNF endurance to >/= 30 sec to improve postural control and reduce neck stiffness and head pressure    Time 8    Period Weeks    Status New    Target Date 04/07/20      PT LONG TERM GOAL #6   Title Patient will report no instances of inbalance with  walking in community to improve ability to exercise    Time 8    Period Weeks    Status New    Target Date 04/07/20                 Plan - 03/12/20 1005    Clinical Impression Statement Patient tolerated therapy well with no adverse effects. He reports great improvement with functional status and reports improvement in his symtpoms with exercises. Therapy continues to focus on improving mobility and strength for postural control with good tolerance. Incorporated supine DNF endurance training with good tolerance, patient did exhibit difficulty with . He would benefit from continued skilled PT to progress his strength and mobility in order to reduce pain or limitation and maximize functional ability.    PT Treatment/Interventions ADLs/Self Care Home Management;Cryotherapy;Electrical Stimulation;Iontophoresis 4mg /ml Dexamethasone;Moist Heat;Traction;Ultrasound;Neuromuscular re-education;Balance training;Therapeutic exercise;Therapeutic activities;Functional mobility training;Stair training;Gait training;Patient/family education;Manual techniques;Dry needling;Passive range of motion;Taping;Spinal Manipulations;Joint Manipulations    PT Next Visit Plan Review HEP and progress PRN, continue postural strengthening, incorporate thoracic mobility and core strengthening; further balance assessment and exercise PRN    PT Home Exercise Plan    Consulted and Agree with Plan of Care Patient           Patient will benefit from skilled therapeutic intervention in order to improve the following deficits and impairments:  Decreased range of motion,Postural dysfunction,Decreased strength,Pain,Decreased activity tolerance,Decreased balance  Visit Diagnosis: Cervicalgia  Abnormal posture  Muscle weakness (generalized)     Problem List Patient Active Problem List   Diagnosis Date Noted  . Tension headache 02/03/2020  . Loss of balance 02/03/2020    02/05/2020, PT, DPT, LAT,  ATC 03/12/20  10:44 AM Phone: [phone] Fax: [phone]   [facility] Outpatient Rehabilitation [facility] [address] [place], [place], [place] Phone: [phone]   Fax:  [phone]  Name:  James Vincent MRN: [id number] Date of Birth: Jul 29, 1985

## 2020-03-26 ENCOUNTER — Encounter: Payer: Self-pay | Admitting: Physical Therapy

## 2020-03-26 ENCOUNTER — Ambulatory Visit: Payer: 59 | Attending: Internal Medicine | Admitting: Physical Therapy

## 2020-03-26 ENCOUNTER — Other Ambulatory Visit: Payer: Self-pay

## 2020-03-26 DIAGNOSIS — M6281 Muscle weakness (generalized): Secondary | ICD-10-CM | POA: Diagnosis present

## 2020-03-26 DIAGNOSIS — R293 Abnormal posture: Secondary | ICD-10-CM | POA: Diagnosis present

## 2020-03-26 DIAGNOSIS — M542 Cervicalgia: Secondary | ICD-10-CM | POA: Diagnosis not present

## 2020-03-26 NOTE — Therapy (Signed)
[facility] Outpatient Rehabilitation [facility] [address] [place], [place], [address] Phone: [phone]   Fax:  [phone]  Physical Therapy Treatment  Patient Details  Name: James Vincent MRN: [id number] Date of Birth: Jul 13, 1985 Referring Provider (PT): Anne Shutter, MD   Encounter Date: 03/26/2020   PT End of Session - 03/26/20 1234    Visit Number 6    Number of Visits 8    Date for PT Re-Evaluation 04/07/20    Authorization Type UHC    PT Start Time 1230    PT Stop Time 1315    PT Time Calculation (min) 45 min           History reviewed. No pertinent past medical history.  History reviewed. No pertinent surgical history.  There were no vitals filed for this visit.   Subjective Assessment - 03/26/20 1233    Subjective I feel better walking and feel comfortable. Still have pulling in neck with cervical rotation, but not pain. I still have left upper arm pain with reaching out to the side or laying on left side.    Currently in Pain? No/denies              [facility] PT Assessment - 03/26/20 0001      ROM / Strength   AROM / PROM / Strength AROM      AROM   AROM Assessment Site Shoulder    Right/Left Shoulder Left;Right    Right Shoulder Flexion 165 Degrees    Right Shoulder ABduction 165 Degrees    Left Shoulder Flexion 140 Degrees    Left Shoulder ABduction 125 Degrees    Left Shoulder Internal Rotation --   limted reach 3 inches compared to right     Strength   Right Shoulder Flexion 4+/5    Left Shoulder Flexion 5/5    Left Shoulder ABduction 4/5    Left Shoulder External Rotation 4+/5                         [facility] Adult PT Treatment/Exercise - 03/26/20 0001      Neck Exercises: Machines for Strengthening   UBE (Upper Arm Bike) L3 x 4 min (2 fwd/bwd)      Neck Exercises: Theraband   Shoulder Extension 20 reps    Shoulder Extension Limitations blue    Rows 20 reps    Rows Limitations blue    Shoulder  External Rotation 20 reps;Green    Shoulder External Rotation Limitations double er+scap retraction    Horizontal ABduction 20 reps;Green    Horizontal ABduction Limitations maintaining chin tuck throughout    Other Theraband Exercises standing bilateral yelow band scaption to 90 x 10    Other Theraband Exercises standing red band diagonals with chin tuck/towel at wall   difficulty on left     Neck Exercises: Supine   Other Supine Exercise supine cane pullovers x 10    Other Supine Exercise supine red band pullovers and diagonals 10 x 2      Neck Exercises: Prone   Other Prone Exercise Quadruped serratus press 2 x 10   2nd set with red band                   PT Short Term Goals - 02/28/20 1050      PT SHORT TERM GOAL #1   Title Patient will be I with initial HEP to progress with PT    Time 4  Period Weeks    Status Achieved    Target Date 03/10/20      PT SHORT TERM GOAL #2   Title PT with review FOTO with patient by 3rd visit    Time 4    Period Weeks    Status Achieved             PT Long Term Goals - 03/26/20 1319      PT LONG TERM GOAL #1   Title Patient will be I with final HEP to maintain progress from PT    Time 8    Period Weeks    Status On-going      PT LONG TERM GOAL #2   Title Patient will report improved functional status to >/= 74% on FOTO    Time 8    Period Weeks    Status Achieved      PT LONG TERM GOAL #3   Title Patient will report no tightness or pain with cervical rotation to improve ability to turn head while driving    Baseline still feels slight pull, not pain, no trouble with driving.    Time 8    Period Weeks    Status Achieved      PT LONG TERM GOAL #4   Title Patient will exhibit improved periscapular muscle strenth to >/= 4+/5 MMT to improve postural control and sitting tolerance    Time 8    Status Unable to assess      PT LONG TERM GOAL #5   Title Patient will demonstrate improve DNF endurance to >/= 30 sec to  improve postural control and reduce neck stiffness and head pressure    Time 8    Period Weeks    Status Unable to assess      PT LONG TERM GOAL #6   Title Patient will report no instances of inbalance with walking in community to improve ability to exercise    Time 8    Period Weeks    Status Achieved                 Plan - 03/26/20 1324    Clinical Impression Statement Pt reports he has improved with driving and confidence walking. He reports upper arm pain and limitation in strength in shoulder ROM on left. He demonstrates decreased ROM and MMT left vs right. Continued with neck and scap stabilization  and reviewed HEP. Added standing scaption and flexion with yellow band to increase left shoulder strength.    PT Next Visit Plan Review newest HEP and progress PRN, continue postural strengthening, incorporate thoracic mobility and core strengthening; further balance assessment and exercise PRN    PT Home Exercise Plan [phone]           Patient will benefit from skilled therapeutic intervention in order to improve the following deficits and impairments:  Decreased range of motion,Postural dysfunction,Decreased strength,Pain,Decreased activity tolerance,Decreased balance  Visit Diagnosis: Cervicalgia  Abnormal posture  Muscle weakness (generalized)     Problem List Patient Active Problem List   Diagnosis Date Noted  . Tension headache 02/03/2020  . Loss of balance 02/03/2020    Sherrie Mustache, PTA 03/26/2020, 1:27 PM  [facility] [address] [place], [place], [address] Phone: [phone]   Fax:  [phone]  Name: James Vincent MRN: [id number] Date of Birth: 06-Jul-1985

## 2020-04-07 ENCOUNTER — Ambulatory Visit: Payer: 59 | Admitting: Physical Therapy

## 2020-04-22 ENCOUNTER — Encounter: Payer: Self-pay | Admitting: Physical Therapy

## 2020-04-22 ENCOUNTER — Other Ambulatory Visit: Payer: Self-pay

## 2020-04-22 ENCOUNTER — Ambulatory Visit: Payer: 59 | Attending: Internal Medicine | Admitting: Physical Therapy

## 2020-04-22 DIAGNOSIS — M6281 Muscle weakness (generalized): Secondary | ICD-10-CM | POA: Insufficient documentation

## 2020-04-22 DIAGNOSIS — M25512 Pain in left shoulder: Secondary | ICD-10-CM | POA: Diagnosis present

## 2020-04-22 DIAGNOSIS — M542 Cervicalgia: Secondary | ICD-10-CM | POA: Diagnosis present

## 2020-04-22 DIAGNOSIS — R293 Abnormal posture: Secondary | ICD-10-CM | POA: Insufficient documentation

## 2020-04-22 DIAGNOSIS — G8929 Other chronic pain: Secondary | ICD-10-CM | POA: Diagnosis present

## 2020-04-22 DIAGNOSIS — M25612 Stiffness of left shoulder, not elsewhere classified: Secondary | ICD-10-CM | POA: Insufficient documentation

## 2020-04-22 NOTE — Therapy (Signed)
[place], [place], [address] Phone: [phone]   Fax:  [phone]  Physical Therapy Treatment / ERO  Patient Details  Name: James Vincent MRN: [id number] Date of Birth: April 06, 1985 Referring Provider (PT): Oda Kilts, MD   Encounter Date: 04/22/2020   PT End of Session - 04/22/20 1629    Visit Number 7    Number of Visits 10    Date for PT Re-Evaluation 06/03/20    Authorization Type UHC    PT Start Time 1450    PT Stop Time 1530    PT Time Calculation (min) 40 min    Activity Tolerance Patient tolerated treatment well    Behavior During Therapy [facility] for tasks assessed/performed           History reviewed. No pertinent past medical history.  History reviewed. No pertinent surgical history.  There were no vitals filed for this visit.   Subjective Assessment - 04/22/20 1501    Subjective Patient reports he hurt his lower back and had to cancel his last appointment. He still is having tension headaches, maybe a couple times per week, and they last a couple of minutes (5-10). He feels like his neck exercises help to relieve the headaches. Patient does feel no more limitation with walking. He also notes left motion limitation and pain with lifting.    How long can you sit comfortably? No limitation    How long can you walk comfortably? No limitation    Patient Stated Goals Improve posture with sitting and feeling of balance and strength with walking    Currently in Pain? No/denies              [facility] PT Assessment - 04/22/20 0001      Assessment   Medical Diagnosis Tension headache    Referring Provider (PT) Oda Kilts, MD      Precautions   Precautions None      Restrictions   Weight Bearing Restrictions No      Balance Screen   Has the patient fallen in the past 6 months No    Has the patient had a decrease in activity level because of a fear of falling?  No    Is  the patient reluctant to leave their home because of a fear of falling?  No      Prior Function   Level of Independence Independent      Cognition   Overall Cognitive Status Within Functional Limits for tasks assessed      Observation/Other Assessments   Focus on Therapeutic Outcomes (FOTO)  Not assessed, previously met goal      ROM / Strength   AROM / PROM / Strength AROM      AROM   AROM Assessment Site Shoulder    Right Shoulder Flexion 165 Degrees    Left Shoulder Flexion 140 Degrees   155 post manual   Cervical Flexion 50    Cervical Extension 45    Cervical - Right Side Bend 45    Cervical - Left Side Bend 45    Cervical - Right Rotation 75    Cervical - Left Rotation 75      Strength   Overall Strength Comments Periscapular strength grossly 4/5 MMT      Special Tests   Other special tests DNF endurance 19 seconds  [facility] Adult PT Treatment/Exercise - 04/22/20 0001      Exercises   Exercises Neck      Neck Exercises: Machines for Strengthening   UBE (Upper Arm Bike) L3 x 4 min (2 fwd/bwd)      Neck Exercises: Supine   Neck Retraction 10 reps   10 sec hold   Neck Retraction Limitations chin tuck with lift, hold until fatigue    Other Supine Exercise LTR 10 x 5 sec hold    Other Supine Exercise Supine shoulder AAROM flexion dowel 2 x 10      Manual Therapy   Manual Therapy Joint mobilization;Passive ROM    Joint Mobilization Left GHJ mobs inferior and posterior at various ranges of elevation, grade III-IV    Passive ROM Left shoulder elevation                  PT Education - 04/22/20 1506    Education Details HEP update, POC continuation for neck and left shoulder    Person(s) Educated Patient    Methods Explanation;Demonstration;Tactile cues;Verbal cues;Handout    Comprehension Verbalized understanding;Need further instruction;Returned demonstration;Verbal cues required;Tactile cues required             PT Short Term Goals - 02/28/20 1050      PT SHORT TERM GOAL #1   Title Patient will be I with initial HEP to progress with PT    Time 4    Period Weeks    Status Achieved    Target Date 03/10/20      PT SHORT TERM GOAL #2   Title PT with review FOTO with patient by 3rd visit    Time 4    Period Weeks    Status Achieved             PT Long Term Goals - 04/22/20 1636      PT LONG TERM GOAL #1   Title Patient will be I with final HEP to maintain progress from PT    Time 6    Period Weeks    Status On-going    Target Date 06/03/20      PT LONG TERM GOAL #2   Title Patient will report improved functional status to >/= 74% on FOTO    Time --    Period --    Status Achieved      PT LONG TERM GOAL #3   Title Patient will report no tightness or pain with cervical rotation to improve ability to turn head while driving    Time --    Period --    Status Achieved      PT LONG TERM GOAL #4   Title Patient will exhibit improved periscapular muscle strenth to >/= 4+/5 MMT to improve postural control and sitting tolerance    Baseline Grossly 4/5 MMT    Time 6    Period Weeks    Status On-going    Target Date 06/03/20      PT LONG TERM GOAL #5   Title Patient will demonstrate improve DNF endurance to >/= 30 sec to improve postural control and reduce neck stiffness and head pressure    Baseline Grossly 19 seconds    Time 6    Period Weeks    Status On-going    Target Date 06/03/20      Additional Long Term Goals   Additional Long Term Goals Yes      PT LONG TERM GOAL #6   Title Patient  will report no instances of inbalance with walking in community to improve ability to exercise    Time --    Period --    Status Achieved      PT LONG TERM GOAL #7   Title Patient will exhibit left shoulder elevation >/= 160 deg to improve reaching overhead    Time 6    Period Weeks    Status New    Target Date 06/03/20                 Plan - 04/22/20 1632    Clinical  Impression Statement Patient tolerated therapy well with no adverse effects. Patient reports improvement in neck symtpoms but continues to have occasional transient headaches that he believes are still tension related. Overall these have improved greatly since start of therapy. Patient does continue to have deficit with DNF endurance and with left shoulder mobility. Patient did report new onset low back pain but this has improved. Will update POC to include left shoulder and will continue PT biweekly for 6 weeks to progress neck endurance to reduce headaches and improve left shoulder mobility.    Rehab Potential Good    PT Frequency Biweekly    PT Duration 6 weeks    PT Treatment/Interventions ADLs/Self Care Home Management;Cryotherapy;Electrical Stimulation;Iontophoresis 4mg /ml Dexamethasone;Moist Heat;Traction;Ultrasound;Neuromuscular re-education;Balance training;Therapeutic exercise;Therapeutic activities;Functional mobility training;Stair training;Gait training;Patient/family education;Manual techniques;Dry needling;Passive range of motion;Taping;Spinal Manipulations;Joint Manipulations    PT Next Visit Plan Review HEP and progress PRN, manual for left shoulder mobility, continue DNF endurance and postural strengthening, core strengthening    PT Home Exercise Plan [email or web address]    Consulted and Agree with Plan of Care Patient           Patient will benefit from skilled therapeutic intervention in order to improve the following deficits and impairments:  Decreased range of motion,Postural dysfunction,Decreased strength,Pain,Decreased activity tolerance,Decreased balance  Visit Diagnosis: Cervicalgia  Abnormal posture  Muscle weakness (generalized)  Chronic left shoulder pain  Stiffness of left shoulder, not elsewhere classified     Problem List Patient Active Problem List   Diagnosis Date Noted  . Tension headache 02/03/2020  . Loss of balance 02/03/2020    Hilda Blades, PT, DPT,  LAT, ATC 04/22/20  4:51 PM Phone: [phone] Fax: [facility] [address] [place], [place], [address] Phone: [phone]   Fax:  [phone]  Name: Trayce Maino MRN: [id number] Date of Birth: [birth year]

## 2020-04-22 NOTE — Patient Instructions (Signed)
Access Code: [email or web address] URL: [email or web address] Date: 04/22/2020 Prepared by: Rosana Hoes  Exercises Supine Deep Neck Flexor Training - Repetitions - 1-2 x daily - 7 x weekly - 10 reps - as long as your can hold Supine Lower Trunk Rotation - 1-2 x daily - 7 x weekly - 10 reps - 5 seconds hold Supine Pelvic Tilt - 1-2 x daily - 7 x weekly - 2 sets - 10 reps Sidelying Thoracic Rotation with Open Book - 1-2 x daily - 7 x weekly - 10 reps - 5 seconds hold Banded Row - 1 x daily - 7 x weekly - 2 sets - 15 reps Shoulder External Rotation and Scapular Retraction with Resistance - 1 x daily - 7 x weekly - 2 sets - 15 reps Standing Shoulder Horizontal Abduction with Resistance - 1 x daily - 7 x weekly - 2 sets - 10 reps Scaption with Resistance - 1 x daily - 7 x weekly - 2 sets - 10 reps

## 2020-05-08 ENCOUNTER — Ambulatory Visit: Payer: 59 | Admitting: Physical Therapy

## 2020-05-08 ENCOUNTER — Other Ambulatory Visit: Payer: Self-pay

## 2020-05-08 ENCOUNTER — Encounter: Payer: Self-pay | Admitting: Physical Therapy

## 2020-05-08 DIAGNOSIS — M25612 Stiffness of left shoulder, not elsewhere classified: Secondary | ICD-10-CM

## 2020-05-08 DIAGNOSIS — R293 Abnormal posture: Secondary | ICD-10-CM

## 2020-05-08 DIAGNOSIS — M6281 Muscle weakness (generalized): Secondary | ICD-10-CM

## 2020-05-08 DIAGNOSIS — M542 Cervicalgia: Secondary | ICD-10-CM

## 2020-05-08 DIAGNOSIS — G8929 Other chronic pain: Secondary | ICD-10-CM

## 2020-05-08 NOTE — Therapy (Addendum)
[facility] [address] [place], [place], [address] Phone: [phone]   Fax:  [phone]  Physical Therapy Treatment  Patient Details  Name: James Vincent MRN: [id number] Date of Birth: 10/08/1985 Referring Provider (PT): Anne Shutter, MD   Encounter Date: 05/08/2020   PT End of Session - 05/08/20 1010     Visit Number 8    Number of Visits 10    Date for PT Re-Evaluation 06/03/20    Authorization Type UHC    PT Start Time 1005    PT Stop Time 1100    PT Time Calculation (min) 55 min    Activity Tolerance Patient tolerated treatment well    Behavior During Therapy [facility] for tasks assessed/performed             History reviewed. No pertinent past medical history.  History reviewed. No pertinent surgical history.  There were no vitals filed for this visit.   Subjective Assessment - 05/08/20 1007     Subjective Sleeping on L side is painful, but just limited motion. Neck is feeling alright. Open book caused pain in back and maybe some weakness in legs (went away) but hasn't done it for the past 2 days.    Currently in Pain? Yes    Pain Score 1     Pain Location Neck    Multiple Pain Sites Yes    Pain Score 5   only with sleeping on side   Pain Location Shoulder    Pain Orientation Left                                               [facility] Adult PT Treatment/Exercise - 05/08/20 0001       Neck Exercises: Machines for Strengthening   UBE (Upper Arm Bike) L3 x 4 min (2 fwd/bwd)      Neck Exercises: Theraband   Shoulder Extension Other (comment);15 reps   2 sets   Rows 15 reps;Other (comment)   2sets   Rows Limitations blue    Shoulder External Rotation 15 reps;Other (comment)   2 sets   Shoulder External Rotation Limitations double er+scap retraction, red    Horizontal ABduction 10 reps;Other (comment)   2 sets with chin tucks   Horizontal ABduction Limitations maintaining chin  tuck throughout    Other Theraband Exercises wall walking with forearms all wall and red band 3 laps    Other Theraband Exercises supine green D2 diagnols with chin tucks 2x10      Neck Exercises: Standing   Other Standing Exercises standing bilateral yellow band scaption to 90 x 2x15   slow eccentric     Manual Therapy   Manual Therapy Joint mobilization;Passive ROM    Joint Mobilization Left GHJ mobs inferior and posterior at various ranges of elevation, grade III-IV    Passive ROM Left shoulder elevation and abduction      Neck Exercises: Stretches   Other Neck Stretches Sidelying thoracic rotation 10 x 10 sec                          PT Education - 05/08/20 1103     Education Details HEP update    Person(s) Educated Patient    Methods Explanation;Handout    Comprehension Verbalized understanding;Need further instruction  PT Short Term Goals - 02/28/20 1050       PT SHORT TERM GOAL #1   Title Patient will be I with initial HEP to progress with PT    Time 4    Period Weeks    Status Achieved    Target Date 03/10/20      PT SHORT TERM GOAL #2   Title PT with review FOTO with patient by 3rd visit    Time 4    Period Weeks    Status Achieved                PT Long Term Goals - 04/22/20 1636       PT LONG TERM GOAL #1   Title Patient will be I with final HEP to maintain progress from PT    Time 6    Period Weeks    Status On-going    Target Date 06/03/20      PT LONG TERM GOAL #2   Title Patient will report improved functional status to >/= 74% on FOTO    Time --    Period --    Status Achieved      PT LONG TERM GOAL #3   Title Patient will report no tightness or pain with cervical rotation to improve ability to turn head while driving    Time --    Period --    Status Achieved      PT LONG TERM GOAL #4   Title Patient will exhibit improved periscapular muscle strenth to >/= 4+/5 MMT to improve postural control and  sitting tolerance    Baseline Grossly 4/5 MMT    Time 6    Period Weeks    Status On-going    Target Date 06/03/20      PT LONG TERM GOAL #5   Title Patient will demonstrate improve DNF endurance to >/= 30 sec to improve postural control and reduce neck stiffness and head pressure    Baseline Grossly 19 seconds    Time 6    Period Weeks    Status On-going    Target Date 06/03/20      Additional Long Term Goals   Additional Long Term Goals Yes      PT LONG TERM GOAL #6   Title Patient will report no instances of inbalance with walking in community to improve ability to exercise    Time --    Period --    Status Achieved      PT LONG TERM GOAL #7   Title Patient will exhibit left shoulder elevation >/= 160 deg to improve reaching overhead    Time 6    Period Weeks    Status New    Target Date 06/03/20                        Plan - 05/08/20 1103     Clinical Impression Statement Pt tolerated PT well with no adverse effects. Pt showed improvment in L UE ROM by end of session and within each ROM exercise. Session focused on increasing endurance on all cervical/UE exercises to help with UE strength and postural endurance. Pt shows no pain with exercises, more just limited overhead mobility. Pt required reassurance throughout exercises that he was doing them correctly. Pt continues to benefit from skilled PT in order to help with overhead function and postural endurance to help reduce headaches and improve functional mobility.    PT Treatment/Interventions  ADLs/Self Care Home Management;Cryotherapy;Electrical Stimulation;Iontophoresis 4mg /ml Dexamethasone;Moist Heat;Traction;Ultrasound;Neuromuscular re-education;Balance training;Therapeutic exercise;Therapeutic activities;Functional mobility training;Stair training;Gait training;Patient/family education;Manual techniques;Dry needling;Passive range of motion;Taping;Spinal Manipulations;Joint Manipulations    PT Next Visit Plan  mnaul for L shoulder mobility, progress postural strenghtening, progress HEP    PT Home Exercise Plan    Consulted and Agree with Plan of Care Patient             Patient will benefit from skilled therapeutic intervention in order to improve the following deficits and impairments:  Decreased range of motion,Postural dysfunction,Decreased strength,Pain,Decreased activity tolerance,Decreased balance  Visit Diagnosis: Cervicalgia  Abnormal posture  Muscle weakness (generalized)  Chronic left shoulder pain  Stiffness of left shoulder, not elsewhere classified      Problem List Patient Active Problem List   Diagnosis Date Noted   Tension headache 02/03/2020   Loss of balance 02/03/2020    02/05/2020, SPT 05/08/2020, 11:08 AM  [facility] [address] [place], [place], [place] Phone: [phone]   Fax:  [phone]  Name: James Vincent MRN: Wilford Grist Date of Birth: 09-21-1985

## 2020-05-22 ENCOUNTER — Ambulatory Visit: Payer: 59 | Admitting: Physical Therapy

## 2020-05-25 ENCOUNTER — Other Ambulatory Visit: Payer: Self-pay

## 2020-05-25 ENCOUNTER — Ambulatory Visit: Payer: 59 | Attending: Internal Medicine | Admitting: Physical Therapy

## 2020-05-25 ENCOUNTER — Encounter: Payer: Self-pay | Admitting: Physical Therapy

## 2020-05-25 DIAGNOSIS — M25512 Pain in left shoulder: Secondary | ICD-10-CM | POA: Insufficient documentation

## 2020-05-25 DIAGNOSIS — M25612 Stiffness of left shoulder, not elsewhere classified: Secondary | ICD-10-CM | POA: Insufficient documentation

## 2020-05-25 DIAGNOSIS — G8929 Other chronic pain: Secondary | ICD-10-CM | POA: Insufficient documentation

## 2020-05-25 DIAGNOSIS — M542 Cervicalgia: Secondary | ICD-10-CM | POA: Diagnosis present

## 2020-05-25 DIAGNOSIS — M6281 Muscle weakness (generalized): Secondary | ICD-10-CM | POA: Diagnosis present

## 2020-05-25 DIAGNOSIS — R293 Abnormal posture: Secondary | ICD-10-CM | POA: Insufficient documentation

## 2020-05-25 NOTE — Therapy (Signed)
[facility] [address] [place], [place], [address] Phone: [phone]   Fax:  [phone]  Physical Therapy Treatment  Patient Details  Name: James Vincent MRN: [id number] Date of Birth: October 28, 1985 Referring Provider (PT): Anne Shutter, MD   Encounter Date: 05/25/2020   PT End of Session - 05/25/20 0812    Visit Number 9    Number of Visits 10    Date for PT Re-Evaluation 06/03/20    Authorization Type UHC    PT Start Time 0803    PT Stop Time 0843    PT Time Calculation (min) 40 min    Activity Tolerance Patient tolerated treatment well    Behavior During Therapy [facility] for tasks assessed/performed           History reviewed. No pertinent past medical history.  History reviewed. No pertinent surgical history.  There were no vitals filed for this visit.   Subjective Assessment - 05/25/20 0805    Subjective The neck is better now. We are focusing more on my left shoulder. Sometimes I have pain when I sleep on that side but it is better than it was. I think the ROM is still limited.    Currently in Pain? No/denies                             [facility] Adult PT Treatment/Exercise - 05/25/20 0001      Neck Exercises: Machines for Strengthening   UBE (Upper Arm Bike) L3 x 4 min (2 fwd/bwd)      Neck Exercises: Theraband   Shoulder Extension Other (comment);15 reps   2 sets   Shoulder Extension Limitations blue    Rows 15 reps;Other (comment)   2sets   Rows Limitations blue    Shoulder External Rotation 15 reps;Other (comment)   2 sets, Green   Shoulder External Rotation Limitations double er+scap retraction    Horizontal ABduction Other (comment);15 reps   2 sets with chin tucks   Horizontal ABduction Limitations maintaining chin tuck throughout    Other Theraband Exercises facing wall- yellow band diagonal slides 10 x 2 each      Neck Exercises: Standing   Other Standing Exercises standing  bilateral 1# scaption to 90 x 2x15   slow eccentric   Other Standing Exercises bilateral shoulder flexion wall slides bilat with lift off x 15      Neck Exercises: Supine   Other Supine Exercise supine diagonals 15 reps green      Neck Exercises: Stretches   Other Neck Stretches doorways stretch bilateral x 30 sec                    PT Short Term Goals - 02/28/20 1050      PT SHORT TERM GOAL #1   Title Patient will be I with initial HEP to progress with PT    Time 4    Period Weeks    Status Achieved    Target Date 03/10/20      PT SHORT TERM GOAL #2   Title PT with review FOTO with patient by 3rd visit    Time 4    Period Weeks    Status Achieved             PT Long Term Goals - 04/22/20 1636      PT LONG TERM GOAL #1   Title Patient will be I with final  HEP to maintain progress from PT    Time 6    Period Weeks    Status On-going    Target Date 06/03/20      PT LONG TERM GOAL #2   Title Patient will report improved functional status to >/= 74% on FOTO    Time --    Period --    Status Achieved      PT LONG TERM GOAL #3   Title Patient will report no tightness or pain with cervical rotation to improve ability to turn head while driving    Time --    Period --    Status Achieved      PT LONG TERM GOAL #4   Title Patient will exhibit improved periscapular muscle strenth to >/= 4+/5 MMT to improve postural control and sitting tolerance    Baseline Grossly 4/5 MMT    Time 6    Period Weeks    Status On-going    Target Date 06/03/20      PT LONG TERM GOAL #5   Title Patient will demonstrate improve DNF endurance to >/= 30 sec to improve postural control and reduce neck stiffness and head pressure    Baseline Grossly 19 seconds    Time 6    Period Weeks    Status On-going    Target Date 06/03/20      Additional Long Term Goals   Additional Long Term Goals Yes      PT LONG TERM GOAL #6   Title Patient will report no instances of inbalance  with walking in community to improve ability to exercise    Time --    Period --    Status Achieved      PT LONG TERM GOAL #7   Title Patient will exhibit left shoulder elevation >/= 160 deg to improve reaching overhead    Time 6    Period Weeks    Status New    Target Date 06/03/20                 Plan - 05/25/20 0854    Clinical Impression Statement Pt tolerated session well. Continued with postural and ilateral shoulder strengthening. He has increased pain with end range PROM all planes of left shoulder.    PT Next Visit Plan mnaul for L shoulder mobility, progress postural strenghtening, progress HEP    PT Home Exercise Plan [phone]    Consulted and Agree with Plan of Care Patient           Patient will benefit from skilled therapeutic intervention in order to improve the following deficits and impairments:     Visit Diagnosis: Cervicalgia  Abnormal posture  Muscle weakness (generalized)  Chronic left shoulder pain  Stiffness of left shoulder, not elsewhere classified     Problem List Patient Active Problem List   Diagnosis Date Noted  . Tension headache 02/03/2020  . Loss of balance 02/03/2020    James Vincent , PTA 05/25/2020, 9:01 AM  [facility] [address] [place], [place], [address] Phone: [phone]   Fax:  [phone]  Name: James Vincent MRN: [id number] Date of Birth: [birth year]

## 2020-06-18 ENCOUNTER — Encounter: Payer: Self-pay | Admitting: Physical Therapy

## 2020-06-18 ENCOUNTER — Ambulatory Visit: Payer: 59 | Admitting: Physical Therapy

## 2020-06-18 ENCOUNTER — Other Ambulatory Visit: Payer: Self-pay

## 2020-06-18 DIAGNOSIS — M542 Cervicalgia: Secondary | ICD-10-CM | POA: Diagnosis not present

## 2020-06-18 DIAGNOSIS — M25612 Stiffness of left shoulder, not elsewhere classified: Secondary | ICD-10-CM

## 2020-06-18 DIAGNOSIS — G8929 Other chronic pain: Secondary | ICD-10-CM

## 2020-06-18 DIAGNOSIS — M6281 Muscle weakness (generalized): Secondary | ICD-10-CM

## 2020-06-18 DIAGNOSIS — R293 Abnormal posture: Secondary | ICD-10-CM

## 2020-06-18 NOTE — Patient Instructions (Signed)
Access Code: [email or web address] URL: [email or web address] Date: 06/18/2020 Prepared by: Rosana Hoes  Exercises Supine Deep Neck Flexor Training - Repetitions - 1-2 x daily - 7 x weekly - 10 reps - as long as your can hold Supine Lower Trunk Rotation - 1-2 x daily - 7 x weekly - 10 reps - 5 seconds hold Supine Pelvic Tilt - 1-2 x daily - 7 x weekly - 2 sets - 10 reps Sidelying Thoracic Rotation with Open Book - 1-2 x daily - 7 x weekly - 10 reps - 5 seconds hold Banded Row - 1 x daily - 7 x weekly - 2 sets - 15 reps Shoulder External Rotation and Scapular Retraction with Resistance - 1 x daily - 7 x weekly - 2 sets - 15 reps Standing Shoulder Horizontal Abduction with Resistance - 1 x daily - 7 x weekly - 2 sets - 10 reps Scaption with Resistance - 1 x daily - 7 x weekly - 2 sets - 10 reps Supine PNF D2 Flexion with Resistance - 1 x daily - 7 x weekly - 2 sets - 10 reps Horizontal Wall Walk with Resistance - 1 x daily - 7 x weekly - 2 sets - 10 reps Shoulder Scaption at Wall Thumbs Up

## 2020-06-18 NOTE — Therapy (Signed)
[facility] Outpatient Rehabilitation [facility] [address] [place], [place], [address] Phone: [phone]   Fax:  [phone]  Physical Therapy Treatment / ERO  Progress Note Reporting Period 02/10/2021 to 06/18/2020  See note below for Objective Data and Assessment of Progress/Goals.    Patient Details  Name: James Vincent MRN: [id number] Date of Birth: 1985/08/01 Referring Provider (PT): Anne Shutter, MD   Encounter Date: 06/18/2020   PT End of Session - 06/18/20 1000    Visit Number 10    Number of Visits 15    Date for PT Re-Evaluation 08/13/20    Authorization Type UHC    PT Start Time 1000    PT Stop Time 1043    PT Time Calculation (min) 43 min    Activity Tolerance Patient tolerated treatment well    Behavior During Therapy [facility] for tasks assessed/performed           History reviewed. No pertinent past medical history.  History reviewed. No pertinent surgical history.  There were no vitals filed for this visit.   Subjective Assessment - 06/18/20 1003    Subjective Patient reports he is doing well. He is doing most of his exercises regularly. His left shoulder is better, he is more comfortable sleeping and reaching with his left side. He still struggles with left shoulder range of motion.    Patient Stated Goals Improve posture with sitting and feeling of balance and strength with walking    Currently in Pain? No/denies              [facility] PT Assessment - 06/18/20 0001      Assessment   Medical Diagnosis Tension headache    Referring Provider (PT) Anne Shutter, MD      Precautions   Precautions None      Restrictions   Weight Bearing Restrictions No      Balance Screen   Has the patient fallen in the past 6 months No      Prior Function   Level of Independence Independent      Observation/Other Assessments   Focus on Therapeutic Outcomes (FOTO)  82% functional status      ROM / Strength   AROM / PROM /  Strength AROM;PROM;Strength      AROM   AROM Assessment Site Shoulder    Right Shoulder Extension 50 Degrees    Right Shoulder Flexion 155 Degrees    Right Shoulder ABduction 160 Degrees    Right Shoulder Internal Rotation --   T12   Right Shoulder External Rotation 85 Degrees   T2   Left Shoulder Flexion 120 Degrees    Left Shoulder ABduction 105 Degrees    Left Shoulder Internal Rotation --   L4   Left Shoulder External Rotation 45 Degrees   C7     PROM   PROM Assessment Site Shoulder    Right/Left Shoulder Left    Left Shoulder Flexion 135 Degrees    Left Shoulder Internal Rotation 25 Degrees    Left Shoulder External Rotation 45 Degrees      Strength   Overall Strength Comments Periscapular strength grossly 4/5 MMT    Right Shoulder Flexion 5/5    Right Shoulder ABduction 4+/5    Right Shoulder Internal Rotation 5/5    Right Shoulder External Rotation 4+/5    Left Shoulder Flexion 5/5    Left Shoulder ABduction 4+/5    Left Shoulder Internal Rotation 5/5    Left Shoulder External  Rotation 4+/5      Special Tests   Other special tests DNF endurance 27 seconds                         [facility] Adult PT Treatment/Exercise - 06/18/20 0001      Exercises   Exercises Shoulder   review of current HEP     Shoulder Exercises: Standing   Other Standing Exercises Left shoulder elevation self MWM using band 2 x 10, self overpressure applied at end range      Shoulder Exercises: ROM/Strengthening   UBE (Upper Arm Bike) L2 x 4 min (2 fwd/bwd)      Manual Therapy   Manual Therapy Joint mobilization;Passive ROM    Manual therapy comments Left shoulder elevation MWN 3 x 10    Joint Mobilization Left GHJ mobs inferior and posterior at various ranges of elevation, grade III-IV    Passive ROM Left shoulder all planes                  PT Education - 06/18/20 1000    Education Details POC update, HEP, etiology of shoulder stiffness    Person(s) Educated  Patient    Methods Explanation;Demonstration;Verbal cues;Handout    Comprehension Verbalized understanding;Returned demonstration;Verbal cues required;Need further instruction            PT Short Term Goals - 02/28/20 1050      PT SHORT TERM GOAL #1   Title Patient will be I with initial HEP to progress with PT    Time 4    Period Weeks    Status Achieved    Target Date 03/10/20      PT SHORT TERM GOAL #2   Title PT with review FOTO with patient by 3rd visit    Time 4    Period Weeks    Status Achieved             PT Long Term Goals - 06/18/20 1113      PT LONG TERM GOAL #1   Title Patient will be I with final HEP to maintain progress from PT    Time 8    Period Weeks    Status On-going    Target Date 08/13/20      PT LONG TERM GOAL #2   Title Patient will report improved functional status to >/= 74% on FOTO    Status Achieved      PT LONG TERM GOAL #3   Title Patient will report no tightness or pain with cervical rotation to improve ability to turn head while driving    Status Achieved      PT LONG TERM GOAL #4   Title Patient will exhibit improved periscapular muscle strenth to >/= 4+/5 MMT to improve postural control and sitting tolerance    Baseline Grossly 4/5 MMT    Time 8    Period Weeks    Status On-going    Target Date 08/13/20      PT LONG TERM GOAL #5   Title Patient will demonstrate improve DNF endurance to >/= 30 sec to improve postural control and reduce neck stiffness and head pressure    Baseline Grossly 27 seconds    Time 8    Period Weeks    Status On-going    Target Date 08/13/20      PT LONG TERM GOAL #6   Title Patient will report no instances of inbalance with walking in community  to improve ability to exercise    Status Achieved      PT LONG TERM GOAL #7   Title Patient will exhibit left shoulder elevation >/= 150 deg to improve reaching overhead    Baseline Grossly 120 deg    Time 8    Period Weeks    Status On-going     Target Date 08/13/20                 Plan - 06/18/20 1005    Clinical Impression Statement Patient tolerated therapy well with no adverse effects. He returns to therapy after a few weeks off and reports continued left shoulder stiffness with improved pain. His symptoms seem consistent with left adhesive capsulitis as AROM is relative equal to PROM, and he is likely in the improving stages as his pain has greatly improved. Patient does exhibit limitation in left shoulder mobility with good strength within his available range. HEP updated this visit to incorporate shoulder mobility exercise and encouraged to continue working on previous exercises for postural control. Patient would benefit from continued skilled PT to improve left shoulder range of motion and progress strength to reduce pain and maximize functional ability.    Personal Factors and Comorbidities Past/Current Experience;Time since onset of injury/illness/exacerbation    Examination-Activity Limitations Reach Overhead    PT Frequency 1x / week   patient only able to schedule 5 visits   PT Duration 8 weeks    PT Treatment/Interventions ADLs/Self Care Home Management;Cryotherapy;Electrical Stimulation;Iontophoresis 4mg /ml Dexamethasone;Moist Heat;Traction;Ultrasound;Neuromuscular re-education;Balance training;Therapeutic exercise;Therapeutic activities;Functional mobility training;Stair training;Gait training;Patient/family education;Manual techniques;Dry needling;Passive range of motion;Taping;Spinal Manipulations;Joint Manipulations    PT Next Visit Plan Review HEP and progress PRN, manual and stretching for left shoulder, progress MWM for left shoulder and stretching/mobility for home, postural/scapular control    PT Home Exercise Plan    Consulted and Agree with Plan of Care Patient           Patient will benefit from skilled therapeutic intervention in order to improve the following deficits and impairments:   Decreased range of motion,Postural dysfunction,Decreased strength,Pain,Decreased activity tolerance,Decreased balance  Visit Diagnosis: Stiffness of left shoulder, not elsewhere classified  Chronic left shoulder pain  Muscle weakness (generalized)  Abnormal posture  Cervicalgia     Problem List Patient Active Problem List   Diagnosis Date Noted  . Tension headache 02/03/2020  . Loss of balance 02/03/2020    02/05/2020, PT, DPT, LAT, ATC 06/18/20  11:22 AM Phone: [phone] Fax: [phone]   [facility] Outpatient Rehabilitation [facility] [address] [place], [place], [place] Phone: [phone]   Fax:  [phone]  Name: Khayree Delellis MRN: Wilford Grist Date of Birth: 1985/10/31

## 2020-07-08 ENCOUNTER — Ambulatory Visit: Payer: 59 | Attending: Internal Medicine | Admitting: Physical Therapy

## 2020-07-08 ENCOUNTER — Other Ambulatory Visit: Payer: Self-pay

## 2020-07-08 ENCOUNTER — Encounter: Payer: Self-pay | Admitting: Physical Therapy

## 2020-07-08 DIAGNOSIS — M542 Cervicalgia: Secondary | ICD-10-CM | POA: Diagnosis present

## 2020-07-08 DIAGNOSIS — G8929 Other chronic pain: Secondary | ICD-10-CM

## 2020-07-08 DIAGNOSIS — R293 Abnormal posture: Secondary | ICD-10-CM

## 2020-07-08 DIAGNOSIS — M25512 Pain in left shoulder: Secondary | ICD-10-CM | POA: Diagnosis present

## 2020-07-08 DIAGNOSIS — M6281 Muscle weakness (generalized): Secondary | ICD-10-CM

## 2020-07-08 DIAGNOSIS — M25612 Stiffness of left shoulder, not elsewhere classified: Secondary | ICD-10-CM | POA: Diagnosis present

## 2020-07-08 NOTE — Therapy (Addendum)
[facility] Outpatient Rehabilitation [facility] [address] [place], [place], [address] Phone: [phone]   Fax:  [phone]  Physical Therapy Treatment  Patient Details  Name: James Vincent MRN: [id number] Date of Birth: [birth year] Referring Provider (PT): Anne Shutter, MD   Encounter Date: 07/08/2020   PT End of Session - 07/08/20 0830     Visit Number 11    Number of Visits 15    Date for PT Re-Evaluation 08/13/20    Authorization Type UHC    PT Start Time 0830    PT Stop Time 0914    PT Time Calculation (min) 44 min    Activity Tolerance Patient tolerated treatment well    Behavior During Therapy [facility] for tasks assessed/performed             History reviewed. No pertinent past medical history.  History reviewed. No pertinent surgical history.  There were no vitals filed for this visit.   Subjective Assessment - 07/08/20 0833     Subjective Pt reports doing well. He is doing most of the exercises reguarly, tries mostly everyday. He is noticing his L shoulder going hiper but he is not able to reach behind his back as much (functional IR).    Currently in Pain? No/denies                  [facility] PT Assessment - 07/08/20 0001       AROM   Left Shoulder Flexion 130 Degrees    Left Shoulder ABduction 109 Degrees    Left Shoulder Internal Rotation --   L2   Left Shoulder External Rotation 45 Degrees                                        [facility] Adult PT Treatment/Exercise - 07/08/20 0001       Exercises   Exercises Shoulder      Neck Exercises: Machines for Strengthening   UBE (Upper Arm Bike) L2 x 4 min (77fwd/2bck)      Neck Exercises: Theraband   Shoulder Extension 15 reps   2 sets   Shoulder Extension Limitations blue, focusing on ROM into extension    Shoulder External Rotation 10 reps    Shoulder External Rotation Limitations 45 degrres abduction    Shoulder Internal Rotation 10 reps    Shoulder  Internal Rotation Limitations 90 degrees abduction    Other Theraband Exercises scaption L arm stepping on red band , focusing on ROM 2x10    Other Theraband Exercises dowel extension, cross body abduction and shrugging up x10 each      Neck Exercises: Standing   Other Standing Exercises pushing into foam roller rolling up wall 2x10      Shoulder Exercises: Pulleys   Flexion 1 minute    ABduction 1 minute      Shoulder Exercises: ROM/Strengthening   UBE (Upper Arm Bike) L2 x 4 min (2 fwd/bwd)                          PT Education - 07/08/20 1156     Education Details HEP update    Person(s) Educated Patient    Methods Explanation;Demonstration    Comprehension Verbalized understanding;Returned demonstration;Need further instruction              PT Short Term Goals - 02/28/20 1050  PT SHORT TERM GOAL #1   Title Patient will be I with initial HEP to progress with PT    Time 4    Period Weeks    Status Achieved    Target Date 03/10/20      PT SHORT TERM GOAL #2   Title PT with review FOTO with patient by 3rd visit    Time 4    Period Weeks    Status Achieved                PT Long Term Goals - 06/18/20 1113       PT LONG TERM GOAL #1   Title Patient will be I with final HEP to maintain progress from PT    Time 8    Period Weeks    Status On-going    Target Date 08/13/20      PT LONG TERM GOAL #2   Title Patient will report improved functional status to >/= 74% on FOTO    Status Achieved      PT LONG TERM GOAL #3   Title Patient will report no tightness or pain with cervical rotation to improve ability to turn head while driving    Status Achieved      PT LONG TERM GOAL #4   Title Patient will exhibit improved periscapular muscle strenth to >/= 4+/5 MMT to improve postural control and sitting tolerance    Baseline Grossly 4/5 MMT    Time 8    Period Weeks    Status On-going    Target Date 08/13/20      PT LONG TERM GOAL #5    Title Patient will demonstrate improve DNF endurance to >/= 30 sec to improve postural control and reduce neck stiffness and head pressure    Baseline Grossly 27 seconds    Time 8    Period Weeks    Status On-going    Target Date 08/13/20      PT LONG TERM GOAL #6   Title Patient will report no instances of inbalance with walking in community to improve ability to exercise    Status Achieved      PT LONG TERM GOAL #7   Title Patient will exhibit left shoulder elevation >/= 150 deg to improve reaching overhead    Baseline Grossly 120 deg    Time 8    Period Weeks    Status On-going    Target Date 08/13/20                        Plan - 07/08/20 1159     Clinical Impression Statement Pt tolerated therapy well with no adverse effects. Pt has improve in L shoulder flexion, abduction, and functional IR but has stayed the same with shoulder ER. Further self MWM's and therapist MWM's done this session to help increase overhead ROM. Dowel exercises behind the back introduced to help with functional IR improvement. Theraband exercises focused on increasing strength in full ROM. [place] to benefit from skilled PT in order to improve L shoulder ROM and progress strength to reduce pain and maximize functional ability.    PT Treatment/Interventions ADLs/Self Care Home Management;Cryotherapy;Electrical Stimulation;Iontophoresis 4mg /ml Dexamethasone;Moist Heat;Traction;Ultrasound;Neuromuscular re-education;Balance training;Therapeutic exercise;Therapeutic activities;Functional mobility training;Stair training;Gait training;Patient/family education;Manual techniques;Dry needling;Passive range of motion;Taping;Spinal Manipulations;Joint Manipulations    PT Next Visit Plan Review HEP and progress PRN, manual and stretching for left shoulder, progress MWM for left shoulder and stretching/mobility for home, postural/scapular control  PT Home Exercise Plan [phone]    Consulted and Agree with Plan  of Care Patient             Patient will benefit from skilled therapeutic intervention in order to improve the following deficits and impairments:  Decreased range of motion,Postural dysfunction,Decreased strength,Pain,Decreased activity tolerance,Decreased balance  Visit Diagnosis: Stiffness of left shoulder, not elsewhere classified  Chronic left shoulder pain  Muscle weakness (generalized)  Abnormal posture  Cervicalgia      Problem List Patient Active Problem List   Diagnosis Date Noted   Tension headache 02/03/2020   Loss of balance 02/03/2020    Jeri Cos, SPT 07/08/2020, 1:08 PM  [facility] [address] [place], [place], [address] Phone: [phone]   Fax:  [phone]  Name: James Vincent MRN: [id number] Date of Birth: [birth year]

## 2020-07-08 NOTE — Patient Instructions (Signed)
Access Code: [email or web address] URL: [email or web address] Date: 07/08/2020 Prepared by: Jeri Cos  Exercises Supine Deep Neck Flexor Training - Repetitions - 1-2 x daily - 7 x weekly - 10 reps - as long as your can hold Supine Lower Trunk Rotation - 1-2 x daily - 7 x weekly - 10 reps - 5 seconds hold Supine Pelvic Tilt - 1-2 x daily - 7 x weekly - 2 sets - 10 reps Sidelying Thoracic Rotation with Open Book - 1-2 x daily - 7 x weekly - 10 reps - 5 seconds hold Banded Row - 1 x daily - 7 x weekly - 2 sets - 15 reps Shoulder External Rotation and Scapular Retraction with Resistance - 1 x daily - 7 x weekly - 2 sets - 15 reps Standing Shoulder Horizontal Abduction with Resistance - 1 x daily - 7 x weekly - 2 sets - 10 reps Scaption with Resistance - 1 x daily - 7 x weekly - 2 sets - 10 reps Supine PNF D2 Flexion with Resistance - 1 x daily - 7 x weekly - 2 sets - 10 reps Horizontal Wall Walk with Resistance - 1 x daily - 7 x weekly - 2 sets - 10 reps Shoulder Scaption at Wall Thumbs Up Standing Shoulder Extension with Dowel - 1 x daily - 7 x weekly - 3 sets - 10 reps Standing Shoulder Internal Rotation AAROM with Dowel - 1 x daily - 7 x weekly - 3 sets - 10 reps Standing Bilateral Shoulder Internal Rotation AAROM with Dowel - 1 x daily - 7 x weekly - 3 sets - 10 reps

## 2020-07-23 ENCOUNTER — Encounter: Payer: Self-pay | Admitting: Physical Therapy

## 2020-07-23 ENCOUNTER — Other Ambulatory Visit: Payer: Self-pay

## 2020-07-23 ENCOUNTER — Ambulatory Visit: Payer: 59 | Attending: Internal Medicine | Admitting: Physical Therapy

## 2020-07-23 DIAGNOSIS — M25612 Stiffness of left shoulder, not elsewhere classified: Secondary | ICD-10-CM | POA: Diagnosis not present

## 2020-07-23 DIAGNOSIS — R293 Abnormal posture: Secondary | ICD-10-CM | POA: Insufficient documentation

## 2020-07-23 DIAGNOSIS — M6281 Muscle weakness (generalized): Secondary | ICD-10-CM | POA: Insufficient documentation

## 2020-07-23 DIAGNOSIS — G8929 Other chronic pain: Secondary | ICD-10-CM | POA: Diagnosis present

## 2020-07-23 DIAGNOSIS — M542 Cervicalgia: Secondary | ICD-10-CM | POA: Insufficient documentation

## 2020-07-23 DIAGNOSIS — M25512 Pain in left shoulder: Secondary | ICD-10-CM | POA: Insufficient documentation

## 2020-07-23 NOTE — Patient Instructions (Signed)
Access Code: [email or web address] URL: [email or web address] Date: 07/23/2020 Prepared by: Rosana Hoes  Exercises Supine Deep Neck Flexor Training - Repetitions - 1-2 x daily - 7 x weekly - 10 reps - as long as your can hold Supine Lower Trunk Rotation - 1-2 x daily - 7 x weekly - 10 reps - 5 seconds hold Supine Pelvic Tilt - 1-2 x daily - 7 x weekly - 2 sets - 10 reps Sidelying Thoracic Rotation with Open Book - 1-2 x daily - 7 x weekly - 10 reps - 5 seconds hold Banded Row - 1 x daily - 7 x weekly - 2 sets - 15 reps Shoulder External Rotation and Scapular Retraction with Resistance - 1 x daily - 7 x weekly - 2 sets - 15 reps Standing Shoulder Horizontal Abduction with Resistance - 1 x daily - 7 x weekly - 2 sets - 10 reps Scaption with Resistance - 1 x daily - 7 x weekly - 2 sets - 10 reps Supine PNF D2 Flexion with Resistance - 1 x daily - 7 x weekly - 2 sets - 10 reps Horizontal Wall Walk with Resistance - 1 x daily - 7 x weekly - 2 sets - 10 reps Shoulder Scaption at Wall Thumbs Up Sleeper Stretch - 1-2 x daily - 7 x weekly - 3 reps - 30 hold Sidelying Posterior Cuff Cross Body Stretch - 1/4 Turn Back - 1-2 x daily - 7 x weekly - 3 reps - 30 hold Standing Shoulder Internal Rotation Stretch with Towel - 1-2 x daily - 7 x weekly - 2 sets - 5 reps - 10 seconds hold Prone Shoulder Internal Rotation and Extension - 1 x daily - 7 x weekly - 2 sets - 10 reps

## 2020-07-23 NOTE — Therapy (Signed)
[facility] Outpatient Rehabilitation [facility] [address] [place], [place], [address] Phone: [phone]   Fax:  [phone]  Physical Therapy Treatment  Patient Details  Name: James Vincent MRN: [id number] Date of Birth: [birth year] Referring Provider (PT): Anne Shutter, MD   Encounter Date: 07/23/2020   PT End of Session - 07/23/20 1009    Visit Number 12    Number of Visits 15    Date for PT Re-Evaluation 08/13/20    Authorization Type UHC    PT Start Time 1003    PT Stop Time 1044    PT Time Calculation (min) 41 min    Activity Tolerance Patient tolerated treatment well    Behavior During Therapy [facility] for tasks assessed/performed           History reviewed. No pertinent past medical history.  History reviewed. No pertinent surgical history.  There were no vitals filed for this visit.   Subjective Assessment - 07/23/20 1008    Subjective Patient reports he feels his shoulder continues to improve. He can lift it better without pain but continues to have difficulty reaching behind his back.    Patient Stated Goals Improve posture with sitting and feeling of balance and strength with walking    Currently in Pain? No/denies              [facility] PT Assessment - 07/23/20 0001      AROM   Left Shoulder Flexion 140 Degrees    Left Shoulder Internal Rotation --   T12                        [facility] Adult PT Treatment/Exercise - 07/23/20 0001      Shoulder Exercises: Prone   Other Prone Exercises Shoulder extension into IR behind back 2 x 10      Shoulder Exercises: Standing   Shoulder Elevation 10 reps   2 sets   Shoulder Elevation Limitations at wall, 2nd set with 1#      Shoulder Exercises: Stretch   Internal Rotation Stretch 5 reps   10 sec hold, 2 sets   Wall Stretch - Flexion Limitations 2 x 10    Other Shoulder Stretches Sleeper stretch 3 x 30 sec    Other Shoulder Stretches Cross body stretch 3 x 30 sec      Manual  Therapy   Manual Therapy Joint mobilization;Passive ROM    Joint Mobilization Left GHJ mobs inferior and posterior at various ranges of elevation, grade III-IV    Passive ROM Left shoulder all planes                  PT Education - 07/23/20 1009    Education Details HEP update    Person(s) Educated Patient    Methods Explanation;Demonstration;Tactile cues;Verbal cues;Handout    Comprehension Verbalized understanding;Need further instruction;Returned demonstration;Verbal cues required;Tactile cues required            PT Short Term Goals - 02/28/20 1050      PT SHORT TERM GOAL #1   Title Patient will be I with initial HEP to progress with PT    Time 4    Period Weeks    Status Achieved    Target Date 03/10/20      PT SHORT TERM GOAL #2   Title PT with review FOTO with patient by 3rd visit    Time 4    Period Weeks    Status Achieved  PT Long Term Goals - 06/18/20 1113      PT LONG TERM GOAL #1   Title Patient will be I with final HEP to maintain progress from PT    Time 8    Period Weeks    Status On-going    Target Date 08/13/20      PT LONG TERM GOAL #2   Title Patient will report improved functional status to >/= 74% on FOTO    Status Achieved      PT LONG TERM GOAL #3   Title Patient will report no tightness or pain with cervical rotation to improve ability to turn head while driving    Status Achieved      PT LONG TERM GOAL #4   Title Patient will exhibit improved periscapular muscle strenth to >/= 4+/5 MMT to improve postural control and sitting tolerance    Baseline Grossly 4/5 MMT    Time 8    Period Weeks    Status On-going    Target Date 08/13/20      PT LONG TERM GOAL #5   Title Patient will demonstrate improve DNF endurance to >/= 30 sec to improve postural control and reduce neck stiffness and head pressure    Baseline Grossly 27 seconds    Time 8    Period Weeks    Status On-going    Target Date 08/13/20      PT LONG  TERM GOAL #6   Title Patient will report no instances of inbalance with walking in community to improve ability to exercise    Status Achieved      PT LONG TERM GOAL #7   Title Patient will exhibit left shoulder elevation >/= 150 deg to improve reaching overhead    Baseline Grossly 120 deg    Time 8    Period Weeks    Status On-going    Target Date 08/13/20                 Plan - 07/23/20 1010    Clinical Impression Statement Patient tolerated therapy well with no advers effects. He exhibits improved active motion but continues to report difficulty reaching behind his back. Majority of therapy focused on progressing reach behind back and patient did note feeling easier to reach following manual and stretching. Updated HEP with new stretches to improve reach behind back. Patient would benefit from continued skilled PT to improve left shoulder range of motion and progress strength to reduce pain and maximize functional ability.    PT Treatment/Interventions ADLs/Self Care Home Management;Cryotherapy;Electrical Stimulation;Iontophoresis 4mg /ml Dexamethasone;Moist Heat;Traction;Ultrasound;Neuromuscular re-education;Balance training;Therapeutic exercise;Therapeutic activities;Functional mobility training;Stair training;Gait training;Patient/family education;Manual techniques;Dry needling;Passive range of motion;Taping;Spinal Manipulations;Joint Manipulations    PT Next Visit Plan Review HEP and progress PRN, manual and stretching for left shoulder, progress MWM for left shoulder and stretching/mobility for home, postural/scapular control    PT Home Exercise Plan    Consulted and Agree with Plan of Care Patient           Patient will benefit from skilled therapeutic intervention in order to improve the following deficits and impairments:  Decreased range of motion,Postural dysfunction,Decreased strength,Pain,Decreased activity tolerance,Decreased balance  Visit  Diagnosis: Stiffness of left shoulder, not elsewhere classified  Chronic left shoulder pain  Muscle weakness (generalized)  Abnormal posture  Cervicalgia     Problem List Patient Active Problem List   Diagnosis Date Noted  . Tension headache 02/03/2020  . Loss of balance 02/03/2020    02/05/2020,  PT, DPT, LAT, ATC 07/23/20  10:53 AM Phone: [phone] Fax: [phone]   [facility] Outpatient Rehabilitation [facility] [address] [place], [place], [address] Phone: [phone]   Fax:  [phone]  Name: Jamire Shabazz MRN: [id number] Date of Birth: 06-05-1985

## 2020-07-30 ENCOUNTER — Ambulatory Visit: Payer: 59 | Admitting: Physical Therapy

## 2020-07-30 ENCOUNTER — Encounter: Payer: Self-pay | Admitting: Physical Therapy

## 2020-07-30 ENCOUNTER — Other Ambulatory Visit: Payer: Self-pay

## 2020-07-30 DIAGNOSIS — G8929 Other chronic pain: Secondary | ICD-10-CM

## 2020-07-30 DIAGNOSIS — M542 Cervicalgia: Secondary | ICD-10-CM

## 2020-07-30 DIAGNOSIS — R293 Abnormal posture: Secondary | ICD-10-CM

## 2020-07-30 DIAGNOSIS — M25612 Stiffness of left shoulder, not elsewhere classified: Secondary | ICD-10-CM

## 2020-07-30 DIAGNOSIS — M6281 Muscle weakness (generalized): Secondary | ICD-10-CM

## 2020-07-30 NOTE — Patient Instructions (Signed)
Access Code: [email or web address] URL: [email or web address] Date: 07/30/2020 Prepared by: Rosana Hoes  Exercises Supine Deep Neck Flexor Training - Repetitions - 1-2 x daily - 7 x weekly - 10 reps - as long as your can hold Supine Lower Trunk Rotation - 1-2 x daily - 7 x weekly - 10 reps - 5 seconds hold Supine Pelvic Tilt - 1-2 x daily - 7 x weekly - 2 sets - 10 reps Sidelying Thoracic Rotation with Open Book - 1-2 x daily - 7 x weekly - 10 reps - 5 seconds hold Banded Row - 1 x daily - 7 x weekly - 2 sets - 15 reps Shoulder External Rotation and Scapular Retraction with Resistance - 1 x daily - 7 x weekly - 2 sets - 15 reps Standing Shoulder Horizontal Abduction with Resistance - 1 x daily - 7 x weekly - 2 sets - 10 reps Scaption with Resistance - 1 x daily - 7 x weekly - 2 sets - 10 reps Supine PNF D2 Flexion with Resistance - 1 x daily - 7 x weekly - 2 sets - 10 reps Horizontal Wall Walk with Resistance - 1 x daily - 7 x weekly - 2 sets - 10 reps Shoulder Scaption at Wall Thumbs Up Plank on Table with Scapular Protraction Retraction - 1 x daily - 7 x weekly - 2 sets - 10 reps Sleeper Stretch - 1-2 x daily - 7 x weekly - 3 reps - 30 hold Sidelying Posterior Cuff Cross Body Stretch - 1/4 Turn Back - 1-2 x daily - 7 x weekly - 3 reps - 30 hold Standing Shoulder Internal Rotation Stretch with Towel - 1-2 x daily - 7 x weekly - 2 sets - 5 reps - 10 seconds hold

## 2020-07-30 NOTE — Therapy (Signed)
[facility] Outpatient Rehabilitation [facility] [address] [place], [place], [address] Phone: [phone]   Fax:  [phone]  Physical Therapy Treatment  Patient Details  Name: James Vincent MRN: [id number] Date of Birth: [birth year] Referring Provider (PT): Anne Shutter, MD   Encounter Date: 07/30/2020   PT End of Session - 07/30/20 1003    Visit Number 13    Number of Visits 15    Date for PT Re-Evaluation 08/13/20    Authorization Type UHC    PT Start Time 1000    PT Stop Time 1040    PT Time Calculation (min) 40 min    Activity Tolerance Patient tolerated treatment well    Behavior During Therapy [facility] for tasks assessed/performed           History reviewed. No pertinent past medical history.  History reviewed. No pertinent surgical history.  There were no vitals filed for this visit.   Subjective Assessment - 07/30/20 1002    Subjective Patient reports he is doing well, new exercises are going well, but the exercise laying on stomach increased pressure on back so did not do that one.    Patient Stated Goals Improve posture with sitting and feeling of balance and strength with walking    Currently in Pain? No/denies              [facility] PT Assessment - 07/30/20 0001      AROM   Left Shoulder Flexion 144 Degrees    Left Shoulder ABduction 125 Degrees    Left Shoulder Internal Rotation --   T12     Special Tests   Other special tests DNF endurance 30 seconds                         [facility] Adult PT Treatment/Exercise - 07/30/20 0001      Exercises   Exercises Shoulder      Shoulder Exercises: Sidelying   Other Sidelying Exercises Thoracic rotation 10 x 5 sec hold      Shoulder Exercises: Standing   Protraction Limitations Serratus press on counter 2 x 10    Internal Rotation 10 reps   2 sets   Theraband Level (Shoulder Internal Rotation) Level 4 (Blue)    Internal Rotation Limitations Functional reach behind  back AROM following stretches 2 x 5    Flexion 10 reps   2 sets   Flexion Limitations Yellow loop around wrists    ABduction Limitations Dowel AAROM 2 x 5 with 5 sec hold    Row 15 reps   2 sets   Theraband Level (Shoulder Row) Level 4 (Blue)    Shoulder Elevation 10 reps   2 sets   Shoulder Elevation Limitations 3# scaption to 90 deg    Other Standing Exercises Wall walk with yellow 2 x 10      Shoulder Exercises: ROM/Strengthening   UBE (Upper Arm Bike) L2 x 4 min (2 fwd/bwd)      Shoulder Exercises: Stretch   Internal Rotation Stretch 5 reps   10 sec hold, 2 sets   Wall Stretch - Flexion 5 reps   5 sec hold, 2 sets   Wall Stretch - Flexion Limitations wall slide    Table Stretch - Flexion 5 reps   5 sec hold,2 sets   Table Stretch -Flexion Limitations step back stretch    Other Shoulder Stretches Sleeper stretch 2 x 30 sec    Other Shoulder Stretches  Cross body stretch 2 x 30 sec                  PT Education - 07/30/20 1003    Education Details HEP update    Person(s) Educated Patient    Methods Explanation;Demonstration;Verbal cues;Handout    Comprehension Verbalized understanding;Need further instruction;Returned demonstration;Verbal cues required            PT Short Term Goals - 02/28/20 1050      PT SHORT TERM GOAL #1   Title Patient will be I with initial HEP to progress with PT    Time 4    Period Weeks    Status Achieved    Target Date 03/10/20      PT SHORT TERM GOAL #2   Title PT with review FOTO with patient by 3rd visit    Time 4    Period Weeks    Status Achieved             PT Long Term Goals - 07/30/20 1004      PT LONG TERM GOAL #1   Title Patient will be I with final HEP to maintain progress from PT    Baseline Continuing to update/modify HEP    Time 8    Period Weeks    Status On-going    Target Date 08/13/20      PT LONG TERM GOAL #2   Title Patient will report improved functional status to >/= 74% on FOTO    Status  Achieved      PT LONG TERM GOAL #3   Title Patient will report no tightness or pain with cervical rotation to improve ability to turn head while driving    Status Achieved      PT LONG TERM GOAL #4   Title Patient will exhibit improved periscapular muscle strenth to >/= 4+/5 MMT to improve postural control and sitting tolerance    Baseline Grossly 4/5 MMT    Time 8    Period Weeks    Status On-going    Target Date 08/13/20      PT LONG TERM GOAL #5   Title Patient will demonstrate improve DNF endurance to >/= 30 sec to improve postural control and reduce neck stiffness and head pressure    Baseline 30 seconds    Status Achieved      PT LONG TERM GOAL #6   Title Patient will report no instances of inbalance with walking in community to improve ability to exercise    Status Achieved      PT LONG TERM GOAL #7   Title Patient will exhibit left shoulder elevation >/= 150 deg to improve reaching overhead    Baseline 144 deg    Time 8    Period Weeks    Status On-going    Target Date 08/13/20                 Plan - 07/30/20 1003    Clinical Impression Statement Patient tolerated therapy well with no advers effects. He demonstrates improvement in DNF endurance this visit and continues to denie any neck pain or tightness, also exhibits moved shoulder AROM this visit but continues to report majority of limitation reaching behind back. Therapy focused on continued stretching and strengthening of the shoulder with good tolerance. Updated HEP this visit. Patient would benefit from continued skilled PT to improve left shoulder range of motion and progress strength to reduce pain and maximize functional ability.  PT Treatment/Interventions ADLs/Self Care Home Management;Cryotherapy;Electrical Stimulation;Iontophoresis 4mg /ml Dexamethasone;Moist Heat;Traction;Ultrasound;Neuromuscular re-education;Balance training;Therapeutic exercise;Therapeutic activities;Functional mobility  training;Stair training;Gait training;Patient/family education;Manual techniques;Dry needling;Passive range of motion;Taping;Spinal Manipulations;Joint Manipulations    PT Next Visit Plan Review HEP and progress PRN, manual and stretching for left shoulder, progress MWM for left shoulder and stretching/mobility for home, postural/scapular control    PT Home Exercise Plan    Consulted and Agree with Plan of Care Patient           Patient will benefit from skilled therapeutic intervention in order to improve the following deficits and impairments:  Decreased range of motion,Postural dysfunction,Decreased strength,Pain,Decreased activity tolerance,Decreased balance  Visit Diagnosis: Stiffness of left shoulder, not elsewhere classified  Chronic left shoulder pain  Muscle weakness (generalized)  Abnormal posture  Cervicalgia     Problem List Patient Active Problem List   Diagnosis Date Noted  . Tension headache 02/03/2020  . Loss of balance 02/03/2020    02/05/2020, PT, DPT, LAT, ATC 07/30/20  10:45 AM Phone: [phone] Fax: [phone]   [facility] Outpatient Rehabilitation [facility] [address] [place], [place], [place] Phone: [phone]   Fax:  [phone]  Name: James Vincent MRN: Wilford Grist Date of Birth: Aug 07, 1985

## 2020-08-06 ENCOUNTER — Ambulatory Visit: Payer: 59 | Admitting: Physical Therapy

## 2020-08-12 ENCOUNTER — Other Ambulatory Visit: Payer: Self-pay | Admitting: Family Medicine

## 2020-08-12 ENCOUNTER — Ambulatory Visit
Admission: RE | Admit: 2020-08-12 | Discharge: 2020-08-12 | Disposition: A | Discharge status: Home / Self Care | Payer: 59 | Source: Ambulatory Visit | Attending: Family Medicine | Admitting: Family Medicine

## 2020-08-12 DIAGNOSIS — U071 COVID-19: Secondary | ICD-10-CM

## 2020-08-12 DIAGNOSIS — R059 Cough, unspecified: Secondary | ICD-10-CM

## 2020-08-12 DIAGNOSIS — R0602 Shortness of breath: Secondary | ICD-10-CM

## 2020-08-13 ENCOUNTER — Ambulatory Visit: Payer: 59 | Admitting: Physical Therapy

## 2020-08-20 ENCOUNTER — Ambulatory Visit: Payer: 59 | Attending: Internal Medicine | Admitting: Physical Therapy

## 2020-08-20 ENCOUNTER — Other Ambulatory Visit: Payer: Self-pay

## 2020-08-20 ENCOUNTER — Encounter: Payer: Self-pay | Admitting: Physical Therapy

## 2020-08-20 DIAGNOSIS — M542 Cervicalgia: Secondary | ICD-10-CM | POA: Diagnosis present

## 2020-08-20 DIAGNOSIS — R293 Abnormal posture: Secondary | ICD-10-CM | POA: Insufficient documentation

## 2020-08-20 DIAGNOSIS — M25612 Stiffness of left shoulder, not elsewhere classified: Secondary | ICD-10-CM | POA: Diagnosis present

## 2020-08-20 DIAGNOSIS — M6281 Muscle weakness (generalized): Secondary | ICD-10-CM | POA: Diagnosis present

## 2020-08-20 DIAGNOSIS — G8929 Other chronic pain: Secondary | ICD-10-CM | POA: Insufficient documentation

## 2020-08-20 DIAGNOSIS — M25512 Pain in left shoulder: Secondary | ICD-10-CM | POA: Diagnosis present

## 2020-08-20 NOTE — Therapy (Signed)
[facility] Outpatient Rehabilitation [facility] [address] [place], [place], [address] Phone: [phone]   Fax:  [phone]  Physical Therapy Treatment / ERO  Patient Details  Name: James Vincent MRN: [id number] Date of Birth: August 21, 1985 Referring Provider (PT): Belva Agee, MD   Encounter Date: 08/20/2020   PT End of Session - 08/20/20 0836    Visit Number 14    Number of Visits 18    Date for PT Re-Evaluation 09/17/20    Authorization Type UHC    PT Start Time 0830    PT Stop Time 0912    PT Time Calculation (min) 42 min    Activity Tolerance Patient tolerated treatment well    Behavior During Therapy [facility] for tasks assessed/performed           History reviewed. No pertinent past medical history.  History reviewed. No pertinent surgical history.  There were no vitals filed for this visit.   Subjective Assessment - 08/20/20 0833    Subjective Patient reports he had COVID about 3 weeks ago so wasn't able to do the exercises, he feels like he doesn't have the energy from 3 weeks ago and may have fallen behind. Shoulder is feeling good today with no new issues.    Patient Stated Goals Improve posture with sitting and feeling of balance and strength with walking    Currently in Pain? No/denies              [facility] PT Assessment - 08/20/20 0001      Assessment   Medical Diagnosis Tension headache    Referring Provider (PT) Belva Agee, MD    Hand Dominance Right      Precautions   Precautions None      Restrictions   Weight Bearing Restrictions No      Balance Screen   Has the patient fallen in the past 6 months No      Prior Function   Level of Independence Independent      Observation/Other Assessments   Focus on Therapeutic Outcomes (FOTO)  82% functional status   assessed 3/31 achieving goal     AROM   Left Shoulder Flexion 147 Degrees    Left Shoulder ABduction 165 Degrees   comensated scaption at end range,  equal to opposite side   Left Shoulder Internal Rotation --   T12     Strength   Overall Strength Comments Periscapular strength grossly 4/5 MMT                         [facility] Adult PT Treatment/Exercise - 08/20/20 0001      Self-Care   Self-Care Other Self-Care Comments    Other Self-Care Comments  Exam findings, update POC      Exercises   Exercises Shoulder      Neck Exercises: Supine   Shoulder Flexion 10 reps   2 sets   Shoulder Flexion Limitations holding red band between hands    Upper Extremity D2 10 reps   2 sets   Theraband Level (UE D2) Level 2 (Red)      Shoulder Exercises: Sidelying   Flexion 10 reps   2 sets   Flexion Weight (lbs) 3    ABduction 10 reps   2 sets   ABduction Weight (lbs) 3    Other Sidelying Exercises Thoracic rotation 5 x 5 sec hold      Shoulder Exercises: Standing   External Rotation 15 reps  2 sets   Theraband Level (Shoulder External Rotation) Level 4 (Blue)    Internal Rotation 15 reps   2 sets   Theraband Level (Shoulder Internal Rotation) Level 4 (Blue)    ABduction 10 reps   2 sets, 90 deg   Shoulder ABduction Weight (lbs) 3    Extension 15 reps   2 sets   Theraband Level (Shoulder Extension) Level 4 (Blue)    Row 15 reps   2 sets   Theraband Level (Shoulder Row) Level 4 (Blue)    Shoulder Elevation 10 reps   2 sets   Shoulder Elevation Limitations 3# scaption to 90 deg      Shoulder Exercises: Stretch   Table Stretch - Flexion 5 reps   5 sec   Table Stretch -Flexion Limitations step back L stretch at counter    Other Shoulder Stretches Sleeper stretch 2 x 30 sec    Other Shoulder Stretches Cross body stretch 2 x 30 sec                  PT Education - 08/20/20 0835    Education Details HEP, POC update    Person(s) Educated Patient    Methods Explanation    Comprehension Verbalized understanding;Need further instruction            PT Short Term Goals - 02/28/20 1050      PT SHORT TERM GOAL #1    Title Patient will be I with initial HEP to progress with PT    Time 4    Period Weeks    Status Achieved    Target Date 03/10/20      PT SHORT TERM GOAL #2   Title PT with review FOTO with patient by 3rd visit    Time 4    Period Weeks    Status Achieved             PT Long Term Goals - 08/20/20 4403      PT LONG TERM GOAL #1   Title Patient will be I with final HEP to maintain progress from PT    Baseline Continuing to update/modify HEP, patient inconsistent recently due to COVID    Time 4    Period Weeks    Status On-going    Target Date 09/17/20      PT LONG TERM GOAL #2   Title Patient will report improved functional status to >/= 74% on FOTO    Status Achieved      PT LONG TERM GOAL #3   Title Patient will report no tightness or pain with cervical rotation to improve ability to turn head while driving    Status Achieved      PT LONG TERM GOAL #4   Title Patient will exhibit improved periscapular muscle strenth to >/= 4+/5 MMT to improve postural control and sitting tolerance    Baseline Grossly 4/5 MMT    Time 4    Period Weeks    Status On-going    Target Date 09/17/20      PT LONG TERM GOAL #5   Title Patient will demonstrate improve DNF endurance to >/= 30 sec to improve postural control and reduce neck stiffness and head pressure    Baseline 30 seconds    Status Achieved      PT LONG TERM GOAL #6   Title Patient will report no instances of inbalance with walking in community to improve ability to exercise    Status Achieved  PT LONG TERM GOAL #7   Title Patient will exhibit left shoulder elevation >/= 150 deg to improve reaching overhead    Baseline 144 deg    Time 4    Period Weeks    Status On-going    Target Date 09/17/20                 Plan - 08/20/20 0836    Clinical Impression Statement Patient tolerated therapy well with no advers effects. He is progressing toward established goals but has been unable to attend therapy or  perform exercises recently due to illness so has not been able to progress as expected. He continues to demonstrate periscapular weakness and limitation with shoulder elevation and reach behind back. Therapy focused on continued stretching and strengthening for the shoulder with good tolerance. He did report fatigue with therapy but no pain. No changes maded to HEP this visit. Patient would benefit from continued skilled PT to improve left shoulder range of motion and progress strength to reduce pain and maximize functional ability.    PT Treatment/Interventions ADLs/Self Care Home Management;Cryotherapy;Electrical Stimulation;Iontophoresis 4mg /ml Dexamethasone;Moist Heat;Traction;Ultrasound;Neuromuscular re-education;Balance training;Therapeutic exercise;Therapeutic activities;Functional mobility training;Stair training;Gait training;Patient/family education;Manual techniques;Dry needling;Passive range of motion;Taping;Spinal Manipulations;Joint Manipulations    PT Next Visit Plan Review HEP and progress PRN, manual and stretching for left shoulder, progress MWM for left shoulder and stretching/mobility for home, postural/scapular control    PT Home Exercise Plan    Consulted and Agree with Plan of Care Patient           Patient will benefit from skilled therapeutic intervention in order to improve the following deficits and impairments:  Decreased range of motion,Postural dysfunction,Decreased strength,Pain,Decreased activity tolerance,Decreased balance  Visit Diagnosis: Stiffness of left shoulder, not elsewhere classified  Chronic left shoulder pain  Muscle weakness (generalized)  Abnormal posture  Cervicalgia     Problem List Patient Active Problem List   Diagnosis Date Noted  . Tension headache 02/03/2020  . Loss of balance 02/03/2020    02/05/2020, PT, DPT, LAT, ATC 08/20/20  9:24 AM Phone: [phone] Fax: [phone]   [facility] Outpatient  Rehabilitation [facility] [address] [place], [place], [place] Phone: [phone]   Fax:  [phone]  Name: James Vincent MRN: James Vincent Date of Birth: 21-Jun-1985

## 2020-08-28 ENCOUNTER — Ambulatory Visit: Payer: 59 | Admitting: Physical Therapy

## 2020-08-28 ENCOUNTER — Encounter: Payer: Self-pay | Admitting: Physical Therapy

## 2020-08-28 ENCOUNTER — Other Ambulatory Visit: Payer: Self-pay

## 2020-08-28 DIAGNOSIS — G8929 Other chronic pain: Secondary | ICD-10-CM

## 2020-08-28 DIAGNOSIS — R293 Abnormal posture: Secondary | ICD-10-CM

## 2020-08-28 DIAGNOSIS — M25612 Stiffness of left shoulder, not elsewhere classified: Secondary | ICD-10-CM | POA: Diagnosis not present

## 2020-08-28 DIAGNOSIS — M542 Cervicalgia: Secondary | ICD-10-CM

## 2020-08-28 DIAGNOSIS — M6281 Muscle weakness (generalized): Secondary | ICD-10-CM

## 2020-08-28 NOTE — Patient Instructions (Signed)
Access Code: [email or web address] URL: [email or web address] Date: 08/28/2020 Prepared by: Rosana Hoes  Exercises Supine Deep Neck Flexor Training - Repetitions - 1-2 x daily - 7 x weekly - 10 reps - as long as your can hold Supine Lower Trunk Rotation - 1-2 x daily - 7 x weekly - 10 reps - 5 seconds hold Supine Pelvic Tilt - 1-2 x daily - 7 x weekly - 2 sets - 10 reps Sidelying Thoracic Rotation with Open Book - 1-2 x daily - 7 x weekly - 10 reps - 5 seconds hold Banded Row - 1 x daily - 7 x weekly - 2 sets - 15 reps Shoulder External Rotation and Scapular Retraction with Resistance - 1 x daily - 7 x weekly - 2 sets - 15 reps Standing Shoulder Horizontal Abduction with Resistance - 1 x daily - 7 x weekly - 2 sets - 10 reps Scaption with Resistance - 1 x daily - 7 x weekly - 2 sets - 10 reps Supine PNF D2 Flexion with Resistance - 1 x daily - 7 x weekly - 2 sets - 10 reps Horizontal Wall Walk with Resistance - 1 x daily - 7 x weekly - 2 sets - 10 reps Shoulder Scaption at Wall Thumbs Up Plank on Table with Scapular Protraction Retraction - 1 x daily - 7 x weekly - 2 sets - 10 reps Sleeper Stretch - 1-2 x daily - 7 x weekly - 3 reps - 30 hold Sidelying Posterior Cuff Cross Body Stretch - 1/4 Turn Back - 1-2 x daily - 7 x weekly - 3 reps - 30 hold Standing Shoulder Internal Rotation Stretch with Towel - 1-2 x daily - 7 x weekly - 2 sets - 5 reps - 10 seconds hold

## 2020-08-28 NOTE — Therapy (Signed)
[place], [place], [address] Phone: [phone]   Fax:  [phone]  Physical Therapy Treatment / Discharge  Patient Details  Name: James Vincent MRN: [id number] Date of Birth: 1986-01-21 Referring Provider (PT): Riesa Pope, MD   Encounter Date: 08/28/2020   PT End of Session - 08/28/20 1051     Visit Number 15    Number of Visits 18    Date for PT Re-Evaluation 09/17/20    Authorization Type UHC    PT Start Time 1324    PT Stop Time 1125    PT Time Calculation (min) 40 min    Activity Tolerance Patient tolerated treatment well    Behavior During Therapy [facility] for tasks assessed/performed             History reviewed. No pertinent past medical history.  History reviewed. No pertinent surgical history.  There were no vitals filed for this visit.   Subjective Assessment - 08/28/20 1050     Subjective Patient reports he is doing well, shoulder is feeling good with no pain. He is consistent with his exercises.    Patient Stated Goals Improve posture with sitting and feeling of balance and strength with walking    Currently in Pain? No/denies                [facility] PT Assessment - 08/28/20 0001       Assessment   Medical Diagnosis Tension headache    Referring Provider (PT) Riesa Pope, MD    Hand Dominance Right      Precautions   Precautions None      Restrictions   Weight Bearing Restrictions No      Balance Screen   Has the patient fallen in the past 6 months No      Prior Function   Level of Independence Independent      Observation/Other Assessments   Focus on Therapeutic Outcomes (FOTO)  82% functional status   assessed 3/31 achieving goal     AROM   Left Shoulder Flexion 152 Degrees    Left Shoulder ABduction 165 Degrees    Left Shoulder Internal Rotation --   reach to T10     Strength   Overall Strength Comments Periscapular strength  grossly 4+/5 MMT                           [facility] Adult PT Treatment/Exercise - 08/28/20 0001       Self-Care   Self-Care Other Self-Care Comments    Other Self-Care Comments  Exam findings, POC discharge      Exercises   Exercises Shoulder      Shoulder Exercises: Supine   Diagonals 10 reps   2 sets   Theraband Level (Shoulder Diagonals) Level 2 (Red)      Shoulder Exercises: Standing   Protraction Limitations Serratus press on counter 2 x 10    External Rotation 15 reps   2 sets   Theraband Level (Shoulder External Rotation) Level 4 (Blue)    Internal Rotation 15 reps   2 sets   Theraband Level (Shoulder Internal Rotation) Level 4 (Blue)    ABduction 15 reps   2 sets, palms down to 90   Shoulder ABduction Weight (lbs) 3    Extension 15 reps   2 sets   Theraband Level (Shoulder Extension) Level 4 (Blue)    Row 15 reps   2 sets  Theraband Level (Shoulder Row) Level 4 (Blue)    Shoulder Elevation 15 reps   2 sets   Shoulder Elevation Limitations 3# scaption to 90 deg    Other Standing Exercises Wall walk with red 2 x 10      Shoulder Exercises: ROM/Strengthening   UBE (Upper Arm Bike) L2 x 4 min (2 fwd/bwd)      Shoulder Exercises: Stretch   Other Shoulder Stretches Sleeper stretch 2 x 30 sec    Other Shoulder Stretches Cross body stretch 2 x 30 sec                    PT Education - 08/28/20 1051     Education Details POC discharge, HEP    Person(s) Educated Patient    Methods Explanation    Comprehension Verbalized understanding              PT Short Term Goals - 02/28/20 1050       PT SHORT TERM GOAL #1   Title Patient will be I with initial HEP to progress with PT    Time 4    Period Weeks    Status Achieved    Target Date 03/10/20      PT SHORT TERM GOAL #2   Title PT with review FOTO with patient by 3rd visit    Time 4    Period Weeks    Status Achieved               PT Long Term Goals - 08/28/20 1055        PT LONG TERM GOAL #1   Title Patient will be I with final HEP to maintain progress from PT    Baseline Patient I with final HEP    Time 4    Period Weeks    Status Achieved      PT LONG TERM GOAL #2   Title Patient will report improved functional status to >/= 74% on FOTO    Status Achieved      PT LONG TERM GOAL #3   Title Patient will report no tightness or pain with cervical rotation to improve ability to turn head while driving    Status Achieved      PT LONG TERM GOAL #4   Title Patient will exhibit improved periscapular muscle strenth to >/= 4+/5 MMT to improve postural control and sitting tolerance    Baseline Grossly 4+/5 MMT    Time 4    Period Weeks    Status Achieved      PT LONG TERM GOAL #5   Title Patient will demonstrate improve DNF endurance to >/= 30 sec to improve postural control and reduce neck stiffness and head pressure    Baseline 30 seconds    Status Achieved      PT LONG TERM GOAL #6   Title Patient will report no instances of inbalance with walking in community to improve ability to exercise    Status Achieved      PT LONG TERM GOAL #7   Title Patient will exhibit left shoulder elevation >/= 150 deg to improve reaching overhead    Baseline 152 deg    Time 4    Period Weeks    Status Achieved                   Plan - 08/28/20 1056     Clinical Impression Statement Patient tolerated therapy well with no advers effects. He  has achieved all established goals and is independent with HEP so can continue improving with range of motion and strength. PT no longer indicated so will be formally discharged for therapy.    PT Treatment/Interventions ADLs/Self Care Home Management;Cryotherapy;Electrical Stimulation;Iontophoresis 4mg /ml Dexamethasone;Moist Heat;Traction;Ultrasound;Neuromuscular re-education;Balance training;Therapeutic exercise;Therapeutic activities;Functional mobility training;Stair training;Gait training;Patient/family  education;Manual techniques;Dry needling;Passive range of motion;Taping;Spinal Manipulations;Joint Manipulations    PT Next Visit Plan NA - discharge    PT Home Exercise Plan [phone]    Consulted and Agree with Plan of Care Patient             Patient will benefit from skilled therapeutic intervention in order to improve the following deficits and impairments:  Decreased range of motion, Postural dysfunction, Decreased strength, Pain, Decreased activity tolerance, Decreased balance  Visit Diagnosis: Stiffness of left shoulder, not elsewhere classified  Chronic left shoulder pain  Muscle weakness (generalized)  Abnormal posture  Cervicalgia     Problem List Patient Active Problem List   Diagnosis Date Noted   Tension headache 02/03/2020   Loss of balance 02/03/2020    Hilda Blades, PT, DPT, LAT, ATC 08/28/20  11:28 AM Phone: [phone] Fax: [facility] [address] [place], [place], [address] Phone: [phone]   Fax:  [phone]  Name: James Vincent MRN: [id number] Date of Birth: 1985-11-21   PHYSICAL THERAPY DISCHARGE SUMMARY  Visits from Start of Care: 15  Current functional level related to goals / functional outcomes: See above   Remaining deficits: See above   Education / Equipment: HEP   Patient agrees to discharge. Patient goals were met. Patient is being discharged due to meeting the stated rehab goals.

## 2020-09-22 ENCOUNTER — Encounter: Payer: Self-pay | Admitting: *Deleted

## 2021-01-28 ENCOUNTER — Ambulatory Visit: Payer: 59 | Admitting: Physical Therapy

## 2021-02-04 ENCOUNTER — Ambulatory Visit: Payer: 59 | Attending: Family Medicine | Admitting: Physical Therapy

## 2021-02-04 ENCOUNTER — Other Ambulatory Visit: Payer: Self-pay

## 2021-02-04 ENCOUNTER — Encounter: Payer: Self-pay | Admitting: Physical Therapy

## 2021-02-04 DIAGNOSIS — M62838 Other muscle spasm: Secondary | ICD-10-CM | POA: Diagnosis present

## 2021-02-04 DIAGNOSIS — M542 Cervicalgia: Secondary | ICD-10-CM | POA: Diagnosis present

## 2021-02-04 DIAGNOSIS — R293 Abnormal posture: Secondary | ICD-10-CM | POA: Diagnosis present

## 2021-02-04 NOTE — Patient Instructions (Signed)
Access Code: [email or web address] URL: [email or web address] Date: 02/04/2021 Prepared by: Harrie Foreman  Exercises Seated Scapular Retraction - 3 x daily - 7 x weekly - 1 sets - 5 reps Seated Cervical Retraction - 3 x daily - 7 x weekly - 1 sets - 5 reps - 5 sec hold Seated Cervical Rotation AROM - 3 x daily - 7 x weekly - 1 sets - 5 reps Standing Cervical Sidebending AROM - 3 x daily - 7 x weekly - 1 sets - 5 reps Cervical Extension AROM with Strap - 1 x daily - 7 x weekly - 1 sets - 10 reps Seated Assisted Cervical Rotation with Towel - 1 x daily - 7 x weekly - 1 sets - 10 reps

## 2021-02-04 NOTE — Therapy (Signed)
[facility] [address]  [address] [place], [place], [address] Phone: [phone]   Fax:  [phone]  Physical Therapy Evaluation  Patient Details  Name: James Vincent MRN: [id number] Date of Birth: [birth year] Referring Provider (PT): Deatra James   Encounter Date: 02/04/2021   PT End of Session - 02/04/21 1112     Visit Number 1    Number of Visits 6    Date for PT Re-Evaluation 03/18/21    Authorization Type UHC    PT Start Time 1017    PT Stop Time 1100    PT Time Calculation (min) 43 min    Activity Tolerance Patient tolerated treatment well    Behavior During Therapy [facility] for tasks assessed/performed             History reviewed. No pertinent past medical history.  History reviewed. No pertinent surgical history.  There were no vitals filed for this visit.    Subjective Assessment - 02/04/21 1020     Subjective Patient reports stiffness in neck with all movements, this has been going on for a little over a year.  Had PT last year for problem but has gradually returned.  Has been doing strength exercises and walking at home, but has not done specific neck exercises.  sometimes feels "shaking" in his neck when driving or social exercises.  Sometimes feels lightheaded when moves neck    Pertinent History history chronic neck pain, L frozen shoulder    How long can you sit comfortably? no limitations    How long can you stand comfortably? no limitations    How long can you walk comfortably? no limitations    Diagnostic tests X-ray and MRI negative    Patient Stated Goals be able to move neck    Currently in Pain? No/denies                [facility] PT Assessment - 02/04/21 0001       Assessment   Medical Diagnosis M43.6 Neck Stiffness    Referring Provider (PT) Sun, Charise Carwin    Onset Date/Surgical Date --   about a year   Hand Dominance Right    Next MD Visit not scheduled      Precautions    Precautions None      Restrictions   Weight Bearing Restrictions No      Balance Screen   Has the patient fallen in the past 6 months No    Has the patient had a decrease in activity level because of a fear of falling?  No    Is the patient reluctant to leave their home because of a fear of falling?  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      Cognition   Overall Cognitive Status Within Functional Limits for tasks assessed      Observation/Other Assessments   Focus on Therapeutic Outcomes (FOTO)  70      Sensation   Light Touch Appears Intact      Posture/Postural Control   Posture/Postural Control Postural limitations    Postural Limitations Rounded Shoulders;Forward head      ROM / Strength   AROM / PROM / Strength AROM;PROM;Strength  AROM   Overall AROM  Within functional limits for tasks performed    Overall AROM Comments tested in sitting, no pain but tightness all directions    AROM Assessment Site Cervical    Cervical Flexion 40    Cervical Extension 40    Cervical - Right Side Bend 25    Cervical - Left Side Bend 25    Cervical - Right Rotation 70    Cervical - Left Rotation 75      PROM   Overall PROM  Within functional limits for tasks performed    Overall PROM Comments tested in supine    PROM Assessment Site Cervical    Cervical - Right Rotation 85    Cervical - Left Rotation 85      Strength   Overall Strength Within functional limits for tasks performed    Overall Strength Comments tested in sitting, 5/5 bil UE strength all myotomes.      Palpation   Spinal mobility tightness with PA mobs in cervical spine at C3-C5, tightness at C7/T1 junction, no pain    Palpation comment tightness R UT and levator                         Objective measurements completed on examination: See above findings.       [facility] Adult PT Treatment/Exercise - 02/04/21 0001       Exercises   Exercises Neck      Neck Exercises: Seated   Neck Retraction 5 reps;5 secs    Cervical Rotation Limitations AROM for HEP    Lateral Flexion Limitations AROM for HEP    Shoulder Rolls Backwards;5 reps    Postural Training scap squeezes    Other Seated Exercise self SNAGs with towel for extension and rotation.      Neck Exercises: Sidelying   Other Sidelying Exercise open books x 5      Manual Therapy   Manual Therapy Joint mobilization;Soft tissue mobilization    Manual therapy comments to improve cervical ROM in supine    Joint Mobilization PA mobs grade 3-4 to improve extension, NAGs into rotation    Soft tissue mobilization subocciptal release, gentle traction                     PT Education - 02/04/21 1111     Education Details education on findings, POC, and initial HEP for posture and AROM    Person(s) Educated Patient    Methods Explanation;Demonstration;Verbal cues;Handout    Comprehension Verbalized understanding;Returned demonstration              PT Short Term Goals - 02/04/21 1124       PT SHORT TERM GOAL #1   Title Patient will be independent with initial HEP.    Time 2    Period Weeks    Status New    Target Date 02/18/21               PT Long Term Goals - 02/04/21 1124       PT LONG TERM GOAL #1   Title Patient will be I with final HEP to maintain progress    Time 6    Period Weeks    Status New    Target Date 03/18/21      PT LONG TERM GOAL #2   Title Patient will report improved functional status to >/= 74% on FOTO    Time 6  Period Weeks    Status New    Target Date 03/18/21      PT LONG TERM GOAL #3   Title Patient will report no tightness or pain with cervical rotation to improve ability to turn head while driving    Time 6    Period  Weeks    Status New    Target Date 03/18/21      PT LONG TERM GOAL #4   Title Pt. will be able to flex/extens neck 60 deg for safety with everyday ADLs.    Baseline 40 deg flexion and extension    Time 6    Period Weeks    Status New    Target Date 03/18/21                    Plan - 02/04/21 1116     Clinical Impression Statement Patient is a 35 year old male referred for neck tightness. He has had physical therapy for this in the past with improvement.  Noted deficits in cervical ROM especially with flexion and extension, limited to 40 deg in each direction.  After manual therapy with PA mobs to cervical spine and NAGs into rotation, his ROM improved to 50 deg for flexion and extension, and he also gained 5 deg cervical rotation to L.  He was instructed in HEP for postural education as well as self SNAGs at home to help with ROM.  He is expected to make good progress with his POC.    Personal Factors and Comorbidities Comorbidity 1    Comorbidities history of neck pain    Examination-Activity Limitations Bend    Examination-Participation Restrictions Driving    Stability/Clinical Decision Making Stable/Uncomplicated    Clinical Decision Making Low    Rehab Potential Excellent    PT Frequency 1x / week    PT Duration 6 weeks    PT Treatment/Interventions ADLs/Self Care Home Management;Cryotherapy;Electrical Stimulation;Moist Heat;Ultrasound;Traction;Therapeutic activities;Therapeutic exercise;Neuromuscular re-education;Patient/family education;Manual techniques;Taping;Dry needling;Passive range of motion;Spinal Manipulations;Joint Manipulations    PT Next Visit Plan review HEP, continue exercises for thoracic mobilization, manual therapy to neck    PT Home Exercise Plan Access Code: [email or web address]    Consulted and Agree with Plan of Care Patient             Patient will benefit from skilled therapeutic intervention in order to improve the following deficits and impairments:   Decreased mobility, Increased muscle spasms, Decreased range of motion, Improper body mechanics, Decreased activity tolerance, Increased fascial restricitons, Impaired flexibility, Postural dysfunction, Pain  Visit Diagnosis: Cervicalgia  Abnormal posture  Other muscle spasm     Problem List Patient Active Problem List   Diagnosis Date Noted   Tension headache 02/03/2020   Loss of balance 02/03/2020    Jena Gauss, PT, DPT 02/04/2021, 11:32 AM  [facility] [address]  [address] [place], [place], [address] Phone: [phone]   Fax:  [phone]  Name: James Vincent MRN: [id number] Date of Birth: [birth year]

## 2021-02-10 ENCOUNTER — Other Ambulatory Visit: Payer: Self-pay

## 2021-02-10 ENCOUNTER — Encounter: Payer: Self-pay | Admitting: Physical Therapy

## 2021-02-10 ENCOUNTER — Ambulatory Visit: Payer: 59 | Admitting: Physical Therapy

## 2021-02-10 DIAGNOSIS — M542 Cervicalgia: Secondary | ICD-10-CM | POA: Diagnosis not present

## 2021-02-10 DIAGNOSIS — M62838 Other muscle spasm: Secondary | ICD-10-CM

## 2021-02-10 DIAGNOSIS — R293 Abnormal posture: Secondary | ICD-10-CM

## 2021-02-10 NOTE — Therapy (Signed)
[facility] Outpatient Rehabilitation [facility] [address]  [address] [place], [place], [address] Phone: [phone]   Fax:  [phone]  Physical Therapy Treatment  Patient Details  Name: James Vincent MRN: [id number] Date of Birth: Jul 15, 1985 Referring Provider (PT): Deatra James   Encounter Date: 02/10/2021   PT End of Session - 02/10/21 1538     Visit Number 2    Number of Visits 6    Date for PT Re-Evaluation 03/18/21    Authorization Type UHC    PT Start Time 1535    PT Stop Time 1617    PT Time Calculation (min) 42 min    Activity Tolerance Patient tolerated treatment well    Behavior During Therapy [facility] for tasks assessed/performed             History reviewed. No pertinent past medical history.  History reviewed. No pertinent surgical history.  There were no vitals filed for this visit.   Subjective Assessment - 02/10/21 1535     Subjective Patient reports that his neck has improved after manual therapy last session, less stiff and less intense.    Pertinent History history chronic neck pain, L frozen shoulder    How long can you sit comfortably? no limitations    How long can you stand comfortably? no limitations    How long can you walk comfortably? no limitations    Diagnostic tests X-ray and MRI negative    Patient Stated Goals be able to move neck    Currently in Pain? No/denies                [facility] PT Assessment - 02/10/21 0001       AROM   Cervical Flexion 60    Cervical Extension 50    Cervical - Right Side Bend 40    Cervical - Left Side Bend 35    Cervical - Right Rotation 75    Cervical - Left Rotation 85                           [facility] Adult PT Treatment/Exercise - 02/10/21 0001       Exercises   Exercises Neck      Neck Exercises: Machines for Strengthening   UBE (Upper Arm Bike) L2 3 min forward/3 min backwards      Neck Exercises: Seated   Other Seated Exercise thoracic  extensions with chair      Neck Exercises: Supine   Other Supine Exercise thoracic extension with foam roller x 10      Manual Therapy   Manual Therapy Joint mobilization;Soft tissue mobilization    Manual therapy comments to improve cervical ROM in supine    Joint Mobilization PA mobs grade 3-4 to improve extension, NAGs into rotation, PA mobs to thoracic spine    Soft tissue mobilization subocciptal release, gentle traction, STM to cervical paraspinals.                       PT Short Term Goals - 02/10/21 1626       PT SHORT TERM GOAL #1   Title Patient will be independent with initial HEP.    Time 2    Period Weeks    Status On-going   02/10/21- reports compliance, no questions.   Target Date 02/18/21               PT Long Term Goals -  02/10/21 1626       PT LONG TERM GOAL #1   Title Patient will be I with final HEP to maintain progress    Time 6    Period Weeks    Status On-going      PT LONG TERM GOAL #2   Title Patient will report improved functional status to >/= 74% on FOTO    Time 6    Period Weeks    Status On-going      PT LONG TERM GOAL #3   Title Patient will report no tightness or pain with cervical rotation to improve ability to turn head while driving    Time 6    Period Weeks    Status On-going      PT LONG TERM GOAL #4   Title Pt. will be able to flex/extens neck 60 deg for safety with everyday ADLs.    Baseline 40 deg flexion and extension    Time 6    Period Weeks    Status On-going   02/10/21- 60 deg extension, 50 deg flexion after manual therapy.                  Plan - 02/10/21 1538     Clinical Impression Statement Patient reports decreased stiffness following initial treatment, and good compliance with HEP.  Today focused on manual therapy to continue to improve cervical ROM.  Also instructed in thoracic extension, as upper thoracic spine hypomobile as well, he tolerated sitting thoracic extension better than  using foam roller today.  At end of session he demonstrated further improvement in cervical AROM all directions (see flowsheet).  He would benefit from continued skilled therapy.    Personal Factors and Comorbidities Comorbidity 1    Comorbidities history of neck pain    Examination-Activity Limitations Bend    Examination-Participation Restrictions Driving    Stability/Clinical Decision Making Stable/Uncomplicated    Rehab Potential Excellent    PT Frequency 1x / week    PT Duration 6 weeks    PT Treatment/Interventions ADLs/Self Care Home Management;Cryotherapy;Electrical Stimulation;Moist Heat;Ultrasound;Traction;Therapeutic activities;Therapeutic exercise;Neuromuscular re-education;Patient/family education;Manual techniques;Taping;Dry needling;Passive range of motion;Spinal Manipulations;Joint Manipulations    PT Next Visit Plan review HEP, continue exercises for thoracic mobilization, manual therapy to neck    PT Home Exercise Plan Access Code: [email or web address]    Consulted and Agree with Plan of Care Patient             Patient will benefit from skilled therapeutic intervention in order to improve the following deficits and impairments:  Decreased mobility, Increased muscle spasms, Decreased range of motion, Improper body mechanics, Decreased activity tolerance, Increased fascial restricitons, Impaired flexibility, Postural dysfunction, Pain  Visit Diagnosis: Cervicalgia  Abnormal posture  Other muscle spasm     Problem List Patient Active Problem List   Diagnosis Date Noted   Tension headache 02/03/2020   Loss of balance 02/03/2020    Jena Gauss, PT, DPT 02/10/2021, 4:27 PM  [facility] [address]  [address] [place], [place], [address] Phone: [phone]   Fax:  [phone]  Name: James Vincent MRN: [id number] Date of Birth: 07-Sep-1985

## 2021-02-18 ENCOUNTER — Other Ambulatory Visit: Payer: Self-pay

## 2021-02-18 ENCOUNTER — Encounter: Payer: Self-pay | Admitting: Physical Therapy

## 2021-02-18 ENCOUNTER — Ambulatory Visit: Payer: 59 | Attending: Family Medicine | Admitting: Physical Therapy

## 2021-02-18 DIAGNOSIS — R293 Abnormal posture: Secondary | ICD-10-CM | POA: Insufficient documentation

## 2021-02-18 DIAGNOSIS — M62838 Other muscle spasm: Secondary | ICD-10-CM | POA: Diagnosis present

## 2021-02-18 DIAGNOSIS — M542 Cervicalgia: Secondary | ICD-10-CM | POA: Insufficient documentation

## 2021-02-18 NOTE — Patient Instructions (Signed)

## 2021-02-18 NOTE — Therapy (Signed)
[facility] [address]  [address] [place], [place], [address] Phone: [phone]   Fax:  [phone]  Physical Therapy Treatment  Patient Details  Name: James Vincent MRN: [id number] Date of Birth: 12-27-1985 Referring Provider (PT): Deatra James   Encounter Date: 02/18/2021   PT End of Session - 02/18/21 1113     Visit Number 3    Number of Visits 6    Date for PT Re-Evaluation 03/18/21    Authorization Type UHC    PT Start Time 1021    PT Stop Time 1104    PT Time Calculation (min) 43 min    Activity Tolerance Patient tolerated treatment well    Behavior During Therapy [facility] for tasks assessed/performed             History reviewed. No pertinent past medical history.  History reviewed. No pertinent surgical history.  There were no vitals filed for this visit.   Subjective Assessment - 02/18/21 1022     Subjective Patient reported the he felt more stiffness last week, more in evenings.  Pulling when turns neck.    Pertinent History history chronic neck pain, L frozen shoulder    How long can you sit comfortably? no limitations    How long can you stand comfortably? no limitations    How long can you walk comfortably? no limitations    Diagnostic tests X-ray and MRI negative    Patient Stated Goals be able to move neck    Currently in Pain? No/denies                     [facility] Adult PT Treatment/Exercise - 02/18/21 0001       Neck Exercises: Machines for Strengthening   UBE (Upper Arm Bike) L2 forward/2 min backwards      Manual Therapy   Manual Therapy Joint mobilization;Soft tissue mobilization    Manual therapy comments to improve cervical ROM in supine    Joint Mobilization PA mobs grade 3-4 to improve extension, NAGs into rotation, PA mobs to thoracic spine    Soft tissue mobilization subocciptal release, gentle traction, STM to cervical paraspinals.              Trigger  Point Dry Needling - 02/18/21 0001     Consent Given? Yes    Education Handout Provided Yes    Muscles Treated Head and Neck Upper trapezius;Levator scapulae;Cervical multifidi   bilateral   Upper Trapezius Response Twitch reponse elicited;Palpable increased muscle length    Levator Scapulae Response Twitch response elicited;Palpable increased muscle length    Cervical multifidi Response Twitch reponse elicited;Palpable increased muscle length                   PT Education - 02/18/21 1116     Education Details education on dry needling including risks, benefits and aftercare.    Person(s) Educated Patient    Methods Explanation;Demonstration;Handout    Comprehension Verbalized understanding              PT Short Term Goals - 02/10/21 1626       PT SHORT TERM GOAL #1   Title Patient will be independent with initial HEP.    Time 2    Period Weeks    Status On-going   02/10/21- reports compliance, no questions.   Target Date 02/18/21               PT  Long Term Goals - 02/10/21 1626       PT LONG TERM GOAL #1   Title Patient will be I with final HEP to maintain progress    Time 6    Period Weeks    Status On-going      PT LONG TERM GOAL #2   Title Patient will report improved functional status to >/= 74% on FOTO    Time 6    Period Weeks    Status On-going      PT LONG TERM GOAL #3   Title Patient will report no tightness or pain with cervical rotation to improve ability to turn head while driving    Time 6    Period Weeks    Status On-going      PT LONG TERM GOAL #4   Title Pt. will be able to flex/extens neck 60 deg for safety with everyday ADLs.    Baseline 40 deg flexion and extension    Time 6    Period Weeks    Status On-going   02/10/21- 60 deg extension, 50 deg flexion after manual therapy.                  Plan - 02/18/21 1113     Clinical Impression Statement Patient reports continued stiffness in neck with rotation and  tightness top of head still.  Discussed benefits of dry needling and risks, after discussion he consented to dry needling today, followed by manual therapy.  Noted significant decrease tightness throughout cervical musculature following dry needling and manual therapy, but still some hypomobility in cervical spine itself with PA mobs.  He would benefit from continued skilled therapy.    Personal Factors and Comorbidities Comorbidity 1    Comorbidities history of neck pain    Examination-Activity Limitations Bend    Examination-Participation Restrictions Driving    Stability/Clinical Decision Making Stable/Uncomplicated    Rehab Potential Excellent    PT Frequency 1x / week    PT Duration 6 weeks    PT Treatment/Interventions ADLs/Self Care Home Management;Cryotherapy;Electrical Stimulation;Moist Heat;Ultrasound;Traction;Therapeutic activities;Therapeutic exercise;Neuromuscular re-education;Patient/family education;Manual techniques;Taping;Dry needling;Passive range of motion;Spinal Manipulations;Joint Manipulations    PT Next Visit Plan review HEP, continue exercises for thoracic mobilization, manual therapy to neck    PT Home Exercise Plan Access Code: [email or web address]    Consulted and Agree with Plan of Care Patient             Patient will benefit from skilled therapeutic intervention in order to improve the following deficits and impairments:  Decreased mobility, Increased muscle spasms, Decreased range of motion, Improper body mechanics, Decreased activity tolerance, Increased fascial restricitons, Impaired flexibility, Postural dysfunction, Pain  Visit Diagnosis: Cervicalgia  Abnormal posture  Other muscle spasm     Problem List Patient Active Problem List   Diagnosis Date Noted   Tension headache 02/03/2020   Loss of balance 02/03/2020    Jena Gauss, PT, DPT 02/18/2021, 11:18 AM  [facility] [address]   [address] [place], [place], [address] Phone: [phone]   Fax:  [phone]  Name: James Vincent MRN: [id number] Date of Birth: 1985/12/07

## 2021-02-22 ENCOUNTER — Encounter: Payer: 59 | Admitting: Physical Therapy

## 2021-02-25 ENCOUNTER — Encounter: Payer: Self-pay | Admitting: Physical Therapy

## 2021-02-25 ENCOUNTER — Ambulatory Visit: Payer: 59 | Admitting: Physical Therapy

## 2021-02-25 ENCOUNTER — Other Ambulatory Visit: Payer: Self-pay

## 2021-02-25 DIAGNOSIS — M542 Cervicalgia: Secondary | ICD-10-CM

## 2021-02-25 DIAGNOSIS — M62838 Other muscle spasm: Secondary | ICD-10-CM

## 2021-02-25 DIAGNOSIS — R293 Abnormal posture: Secondary | ICD-10-CM

## 2021-02-25 NOTE — Therapy (Signed)
[facility] [address]  [address] [place], [place], [address] Phone: [phone]   Fax:  [phone]  Physical Therapy Treatment  Patient Details  Name: James Vincent MRN: [id number] Date of Birth: Aug 09, 1985 Referring Provider (PT): Deatra James   Encounter Date: 02/25/2021   PT End of Session - 02/25/21 1013     Visit Number 4    Number of Visits 6    Date for PT Re-Evaluation 03/18/21    Authorization Type UHC    PT Start Time 1015    PT Stop Time 1110    PT Time Calculation (min) 55 min    Activity Tolerance Patient tolerated treatment well    Behavior During Therapy [facility] for tasks assessed/performed             History reviewed. No pertinent past medical history.  History reviewed. No pertinent surgical history.  There were no vitals filed for this visit.   Subjective Assessment - 02/25/21 1014     Subjective Patient reports that his shoulder was sore 2-3 days after dry needling, and otherwise did not feel any improvement in neck stiffness and pressure.  Had some flu symptoms this weekend and reported sharp pain in midback yesterday.  Some pain in midback today.    Pertinent History history chronic neck pain, L frozen shoulder    How long can you sit comfortably? no limitations    How long can you stand comfortably? no limitations    How long can you walk comfortably? no limitations    Diagnostic tests X-ray and MRI negative    Patient Stated Goals be able to move neck    Currently in Pain? Yes    Pain Score 2     Pain Location Back    Pain Orientation Mid    Pain Descriptors / Indicators Aching                               [facility] Adult PT Treatment/Exercise - 02/25/21 0001       Exercises   Exercises Neck      Neck Exercises: Machines for Strengthening   UBE (Upper Arm Bike) L3 forward/3 min backwards      Neck Exercises: Theraband   Shoulder Extension 20 reps;Red     Rows 20 reps;Red    Other Theraband Exercises diagonal UE D2 2 x 10 bil RTB      Neck Exercises: Standing   Other Standing Exercises self mobs with foam roller on wall for thoracic spine      Neck Exercises: Seated   Other Seated Exercise self SNAGs with pillowcase into extension      Modalities   Modalities Moist Heat      Moist Heat Therapy   Number Minutes Moist Heat 10 Minutes    Moist Heat Location Cervical      Manual Therapy   Manual Therapy Joint mobilization;Soft tissue mobilization;Myofascial release    Manual therapy comments to improve cervical and thoracic mobility    Joint Mobilization PA mobs grade 3-4 to thoracic spine (T4-T12), UPA mobs at T12 and T6, PA mobs cervical spine grade 3-4 and NAGs into rotation cervical spine.  R 1st rib mobs    Soft tissue mobilization subocciptal release, gentle traction, STM to cervical paraspinals, bil SCM    Myofascial Release TPR to thoracic paraspinals at T12 R side  PT Short Term Goals - 02/25/21 1208       PT SHORT TERM GOAL #1   Title Patient will be independent with initial HEP.    Time 2    Period Weeks    Status Achieved   02/10/21- reports compliance, no questions.   Target Date 02/18/21               PT Long Term Goals - 02/25/21 1208       PT LONG TERM GOAL #1   Title Patient will be I with final HEP to maintain progress    Time 6    Period Weeks    Status On-going      PT LONG TERM GOAL #2   Title Patient will report improved functional status to >/= 74% on FOTO    Time 6    Period Weeks    Status On-going      PT LONG TERM GOAL #3   Title Patient will report no tightness or pain with cervical rotation to improve ability to turn head while driving    Time 6    Period Weeks    Status On-going      PT LONG TERM GOAL #4   Title Pt. will be able to flex/extens neck 60 deg for safety with everyday ADLs.    Baseline 40 deg flexion and extension    Time 6     Period Weeks    Status On-going   02/10/21- 60 deg extension, 50 deg flexion after manual therapy.                  Plan - 02/25/21 1206     Clinical Impression Statement Patient reports soreness today in midback and R UT.  Noted today trigger point at T12 which resolved with TPR and elevated 1st rib on R.  Focused on scapular strengthening and self mobs for cervical and thoracic spine, followed by manual therapy to thoracic spine and cervical spine to improve mobility.  MHP at end of session for muscle soreness.  Pt. reported decreased pain at end of session and would benefit from continued skilled therapy.    Personal Factors and Comorbidities Comorbidity 1    Comorbidities history of neck pain    Examination-Activity Limitations Bend    Examination-Participation Restrictions Driving    Stability/Clinical Decision Making Stable/Uncomplicated    Rehab Potential Excellent    PT Frequency 1x / week    PT Duration 6 weeks    PT Treatment/Interventions ADLs/Self Care Home Management;Cryotherapy;Electrical Stimulation;Moist Heat;Ultrasound;Traction;Therapeutic activities;Therapeutic exercise;Neuromuscular re-education;Patient/family education;Manual techniques;Taping;Dry needling;Passive range of motion;Spinal Manipulations;Joint Manipulations    PT Next Visit Plan review HEP, continue exercises for thoracic mobilization, manual therapy to neck    PT Home Exercise Plan Access Code: [email or web address]    Consulted and Agree with Plan of Care Patient             Patient will benefit from skilled therapeutic intervention in order to improve the following deficits and impairments:  Decreased mobility, Increased muscle spasms, Decreased range of motion, Improper body mechanics, Decreased activity tolerance, Increased fascial restricitons, Impaired flexibility, Postural dysfunction, Pain  Visit Diagnosis: Cervicalgia  Abnormal posture  Other muscle spasm     Problem List Patient Active  Problem List   Diagnosis Date Noted   Tension headache 02/03/2020   Loss of balance 02/03/2020    Jena Gauss, PT, DPT 02/25/2021, 12:09 PM  [facility] [address]  [address] [place], [place], [address] Phone: [phone]   Fax:  [phone]  Name: James Vincent MRN: [id number] Date of Birth: 15-Jul-1985

## 2021-03-04 ENCOUNTER — Encounter: Payer: 59 | Admitting: Physical Therapy

## 2021-03-09 ENCOUNTER — Other Ambulatory Visit: Payer: Self-pay

## 2021-03-09 ENCOUNTER — Encounter: Payer: Self-pay | Admitting: Physical Therapy

## 2021-03-09 ENCOUNTER — Ambulatory Visit: Payer: 59 | Admitting: Physical Therapy

## 2021-03-09 DIAGNOSIS — M542 Cervicalgia: Secondary | ICD-10-CM | POA: Diagnosis not present

## 2021-03-09 DIAGNOSIS — M62838 Other muscle spasm: Secondary | ICD-10-CM

## 2021-03-09 DIAGNOSIS — R293 Abnormal posture: Secondary | ICD-10-CM

## 2021-03-09 NOTE — Therapy (Signed)
[facility] [address]  [address] [place], [place], [address] Phone: [phone]   Fax:  [phone]  Physical Therapy Treatment  Patient Details  Name: James Vincent MRN: [id number] Date of Birth: 01-31-1986 Referring Provider (PT): Deatra James   Encounter Date: 03/09/2021   PT End of Session - 03/09/21 1705     Visit Number 5    Number of Visits 6    Date for PT Re-Evaluation 03/18/21    Authorization Type UHC    PT Start Time 1704    PT Stop Time 1745    PT Time Calculation (min) 41 min    Activity Tolerance Patient tolerated treatment well    Behavior During Therapy [facility] for tasks assessed/performed             History reviewed. No pertinent past medical history.  History reviewed. No pertinent surgical history.  There were no vitals filed for this visit.   Subjective Assessment - 03/09/21 1706     Subjective Pt. reports doing well, still has occasional pressure on top of head, and sometimes pulling when turns head to the left    Pertinent History history chronic neck pain, L frozen shoulder    How long can you sit comfortably? no limitations    How long can you stand comfortably? no limitations    How long can you walk comfortably? no limitations    Diagnostic tests X-ray and MRI negative    Patient Stated Goals be able to move neck    Currently in Pain? No/denies                [facility] PT Assessment - 03/09/21 0001       AROM   Cervical Flexion 50   tight today   Cervical Extension 50    Cervical - Right Rotation 75    Cervical - Left Rotation 80                           [facility] Adult PT Treatment/Exercise - 03/09/21 0001       Exercises   Exercises Neck      Neck Exercises: Machines for Strengthening   UBE (Upper Arm Bike) L3 forward/3 min backwards      Neck Exercises: Theraband   Other Theraband Exercises diagonal UE D2 2 x 10 bil RTB      Neck  Exercises: Supine   Cervical Rotation 10 reps    Cervical Rotation Limitations with chin tuck for strengthening      Neck Exercises: Sidelying   Other Sidelying Exercise open books x 10 bil      Neck Exercises: Prone   Neck Retraction 10 reps;3 secs    Other Prone Exercise cat cows x 10 with cues for neck flexion/extension    Other Prone Exercise prone press-ups x 10      Manual Therapy   Manual Therapy Joint mobilization;Soft tissue mobilization;Myofascial release    Manual therapy comments to improve cervical and thoracic mobility    Joint Mobilization PA mobs grade 3-4 to thoracic spine (T4-T12), UPA mobs at T12 and T6, PA mobs cervical spine grade 3-4 and NAGs into rotation cervical spine.  R 1st rib mobs    Soft tissue mobilization subocciptal release, gentle traction, STM to cervical paraspinals, bil SCM                     PT  Education - 03/09/21 1801     Education Details HEP progression.    Person(s) Educated Patient    Methods Explanation;Demonstration;Verbal cues;Handout    Comprehension Verbalized understanding;Returned demonstration              PT Short Term Goals - 02/25/21 1208       PT SHORT TERM GOAL #1   Title Patient will be independent with initial HEP.    Time 2    Period Weeks    Status Achieved   02/10/21- reports compliance, no questions.   Target Date 02/18/21               PT Long Term Goals - 03/09/21 1709       PT LONG TERM GOAL #1   Title Patient will be I with final HEP to maintain progress    Time 6    Period Weeks    Status On-going      PT LONG TERM GOAL #2   Title Patient will report improved functional status to >/= 74% on FOTO    Time 6    Period Weeks    Status On-going      PT LONG TERM GOAL #3   Title Patient will report no tightness or pain with cervical rotation to improve ability to turn head while driving    Time 6    Period Weeks    Status On-going   03/09/21- pulling with turning to L, no  pain     PT LONG TERM GOAL #4   Title Pt. will be able to flex/extend neck 60 deg for safety with everyday ADLs.    Baseline 40 deg flexion and extension    Time 6    Period Weeks    Status On-going   02/10/21- 60 deg extension, 50 deg flexion after manual therapy.  03/09/21- still limited, 50 deg flexion and extension                  Plan - 03/09/21 1753     Clinical Impression Statement Patient reports continued tightness in neck, noted continued hypomobility at C3/4.  Reviewed and progressed HEP today to focus on rotational movements, noted tends to guard neck, needed cues to flex/extend neck with cat/cows.  Demonstrated improved ROM and decreased tightness after manual therapy.  Asked to focus on self snags with towel this week.    Personal Factors and Comorbidities Comorbidity 1    Comorbidities history of neck pain    Examination-Activity Limitations Bend    Examination-Participation Restrictions Driving    Stability/Clinical Decision Making Stable/Uncomplicated    Rehab Potential Excellent    PT Frequency 1x / week    PT Duration 6 weeks    PT Treatment/Interventions ADLs/Self Care Home Management;Cryotherapy;Electrical Stimulation;Moist Heat;Ultrasound;Traction;Therapeutic activities;Therapeutic exercise;Neuromuscular re-education;Patient/family education;Manual techniques;Taping;Dry needling;Passive range of motion;Spinal Manipulations;Joint Manipulations    PT Next Visit Plan review HEP, 30 day hold or D/C    PT Home Exercise Plan Access Code: [email or web address]    Consulted and Agree with Plan of Care Patient             Patient will benefit from skilled therapeutic intervention in order to improve the following deficits and impairments:  Decreased mobility, Increased muscle spasms, Decreased range of motion, Improper body mechanics, Decreased activity tolerance, Increased fascial restricitons, Impaired flexibility, Postural dysfunction, Pain  Visit  Diagnosis: Cervicalgia  Abnormal posture  Other muscle spasm     Problem List Patient Active Problem List   Diagnosis  Date Noted   Tension headache 02/03/2020   Loss of balance 02/03/2020    Jena Gauss, PT, DPT  03/09/2021, 6:02 PM  [facility] [address]  [address] [place], [place], [address] Phone: [phone]   Fax:  [phone]  Name: James Vincent MRN: [id number] Date of Birth: [birth year]

## 2021-03-09 NOTE — Patient Instructions (Signed)
Cat Cow - 1 x daily - 7 x weekly - 2 sets - 10 reps Cobra - 1 x daily - 7 x weekly - 2 sets - 10 reps Supine Cervical Rotation AROM on Pillow - 1 x daily - 7 x weekly - 1 sets - 10 reps Supine Hamstring Stretch with Strap - 1 x daily - 7 x weekly - 1 sets - 3 reps - 30-45 sec hold Double Leg Hamstring Stretch at Wall - 1 x daily - 7 x weekly - 1 sets - 1 reps - 3-5 min hold

## 2021-03-16 ENCOUNTER — Ambulatory Visit: Payer: 59 | Admitting: Physical Therapy

## 2021-03-16 ENCOUNTER — Encounter: Payer: Self-pay | Admitting: Physical Therapy

## 2021-03-16 DIAGNOSIS — M542 Cervicalgia: Secondary | ICD-10-CM | POA: Diagnosis not present

## 2021-03-16 DIAGNOSIS — M62838 Other muscle spasm: Secondary | ICD-10-CM

## 2021-03-16 DIAGNOSIS — R293 Abnormal posture: Secondary | ICD-10-CM

## 2021-03-16 NOTE — Therapy (Signed)
[facility] [address]  [place], [place], [address] Phone: [phone]   Fax:  [phone]  Physical Therapy Treatment PHYSICAL THERAPY DISCHARGE SUMMARY  Visits from Start of Care: 6  Current functional level related to goals / functional outcomes: Decreased tightness, improved cervical ROM   Remaining deficits: Pressure in scalp, decreased flexion/extension   Education / Equipment: HEP   Plan: Patient agrees to discharge.  Patient is being discharged due to meeting the stated rehab goals.       Patient Details  Name: James Vincent MRN: [id number] Date of Birth: Dec 08, 1985 Referring Provider (PT): Donald Prose   Encounter Date: 03/16/2021   PT End of Session - 03/16/21 0932     Visit Number 6    Number of Visits 6    Date for PT Re-Evaluation 03/18/21    Authorization Type [employer]    PT Start Time 0931    PT Stop Time 1015    PT Time Calculation (min) 44 min    Activity Tolerance Patient tolerated treatment well    Behavior During Therapy [facility] for tasks assessed/performed             History reviewed. No pertinent past medical history.  History reviewed. No pertinent surgical history.  There were no vitals filed for this visit.   Subjective Assessment - 03/16/21 0933     Subjective Pt. reports that he is doing about the same, still has the occasional pressure on the top of his head.  Reports about 20% improvement overall.    Pertinent History history chronic neck pain, L frozen shoulder    How long can you sit comfortably? no limitations    How long can you stand comfortably? no limitations    How long can you walk comfortably? no limitations    Diagnostic tests X-ray and MRI negative    Patient Stated Goals be able to move neck    Currently in Pain? No/denies                [facility] PT Assessment - 03/16/21 0001       Assessment   Medical Diagnosis M43.6 Neck Stiffness     Referring Provider (PT) Sun, Gari Crown    Hand Dominance Right      Precautions   Precautions None      Restrictions   Weight Bearing Restrictions No      Observation/Other Assessments   Focus on Therapeutic Outcomes (FOTO)  71      AROM   Cervical Flexion 50    Cervical Extension 50    Cervical - Right Rotation 75    Cervical - Left Rotation 80                           [facility] Adult PT Treatment/Exercise - 03/16/21 0001       Self-Care   Self-Care Other Self-Care Comments    Other Self-Care Comments  reveiwed exercises, suggestions on equipment, massage or chiropractic, ergonomics      Neck Exercises: Machines for Strengthening   UBE (Upper Arm Bike) L3 62min forward/3 min backwards      Manual Therapy   Manual Therapy Joint mobilization;Soft tissue mobilization;Myofascial release    Manual therapy comments to improve cervical and thoracic mobility    Joint Mobilization PA mobs grade 3-4 to thoracic spine (T4-T12), UPA mobs at T12 and T6, PA mobs cervical spine grade 3-4 and NAGs  into rotation cervical spine.    Soft tissue mobilization subocciptal release, gentle traction, STM to cervical paraspinals, bil SCM                       PT Short Term Goals - 02/25/21 1208       PT SHORT TERM GOAL #1   Title Patient will be independent with initial HEP.    Time 2    Period Weeks    Status Achieved   02/10/21- reports compliance, no questions.   Target Date 02/18/21               PT Long Term Goals - 03/16/21 0934       PT LONG TERM GOAL #1   Title Patient will be I with final HEP to maintain progress    Time 6    Period Weeks    Status On-going      PT LONG TERM GOAL #2   Title Patient will report improved functional status to >/= 74% on FOTO    Time 6    Period Weeks    Status Partially Met   71%     PT LONG TERM GOAL #3   Title Patient will report no tightness or pain with cervical rotation to improve ability to turn head  while driving    Time 6    Period Weeks    Status Partially Met   03/16/21- no pain, just tight     PT LONG TERM GOAL #4   Title Pt. will be able to flex/extend neck 60 deg for safety with everyday ADLs.    Baseline 40 deg flexion and extension    Time 6    Period Weeks    Status Partially Met   03/16/2021- 50 deg flexion and extension consistently                  Plan - 03/16/21 1134     Clinical Impression Statement Patient reports about 20% improvement in tightness in neck and pressure in scalp.  He maintained 50 deg flexion/extension, 80 deg L rotation and 75 deg R rotation from previous session, and after manual therapy today did not show any improvement.  We discussed today trying gentle cervical traction device for at home stretching every day and reviewed current HEP.  His FOTO has only improved from 70 to 71.  He has not had any complaint of neck pain and overall has improved his cervical ROM to functional ROM and while he has not met all goals, he has made progress towards all his goals and is ready for discharge.    Personal Factors and Comorbidities Comorbidity 1    Comorbidities history of neck pain    Examination-Activity Limitations [place]    Examination-Participation Restrictions Driving    Stability/Clinical Decision Making Stable/Uncomplicated    Rehab Potential Excellent    PT Frequency 1x / week    PT Duration 6 weeks    PT Treatment/Interventions ADLs/Self Care Home Management;Cryotherapy;Electrical Stimulation;Moist Heat;Ultrasound;Traction;Therapeutic activities;Therapeutic exercise;Neuromuscular re-education;Patient/family education;Manual techniques;Taping;Dry needling;Passive range of motion;Spinal Manipulations;Joint Manipulations    PT Next Visit Plan D/C    PT Home Exercise Plan Access Code: [email or web address]    Consulted and Agree with Plan of Care Patient             Patient will benefit from skilled therapeutic intervention in order to improve the  following deficits and impairments:  Decreased mobility, Increased muscle spasms, Decreased range of motion,  Improper body mechanics, Decreased activity tolerance, Increased fascial restricitons, Impaired flexibility, Postural dysfunction, Pain  Visit Diagnosis: Cervicalgia  Abnormal posture  Other muscle spasm     Problem List Patient Active Problem List   Diagnosis Date Noted   Tension headache 02/03/2020   Loss of balance 02/03/2020    Rennie Natter, PT, DPT  03/16/2021, 11:39 AM  [facility] [address]  [place], [place], [address] Phone: [phone]   Fax:  [phone]  Name: Bates Collington MRN: [id number] Date of Birth: 01/21/1986

## 2021-08-29 IMAGING — DX DG CHEST 1V PORT
1 series · 1 of 1 positions shown · non-contrast
Comparison: July 19, 2019

CLINICAL DATA: Tachycardia

EXAM:
PORTABLE CHEST 1 VIEW

[chest ap]
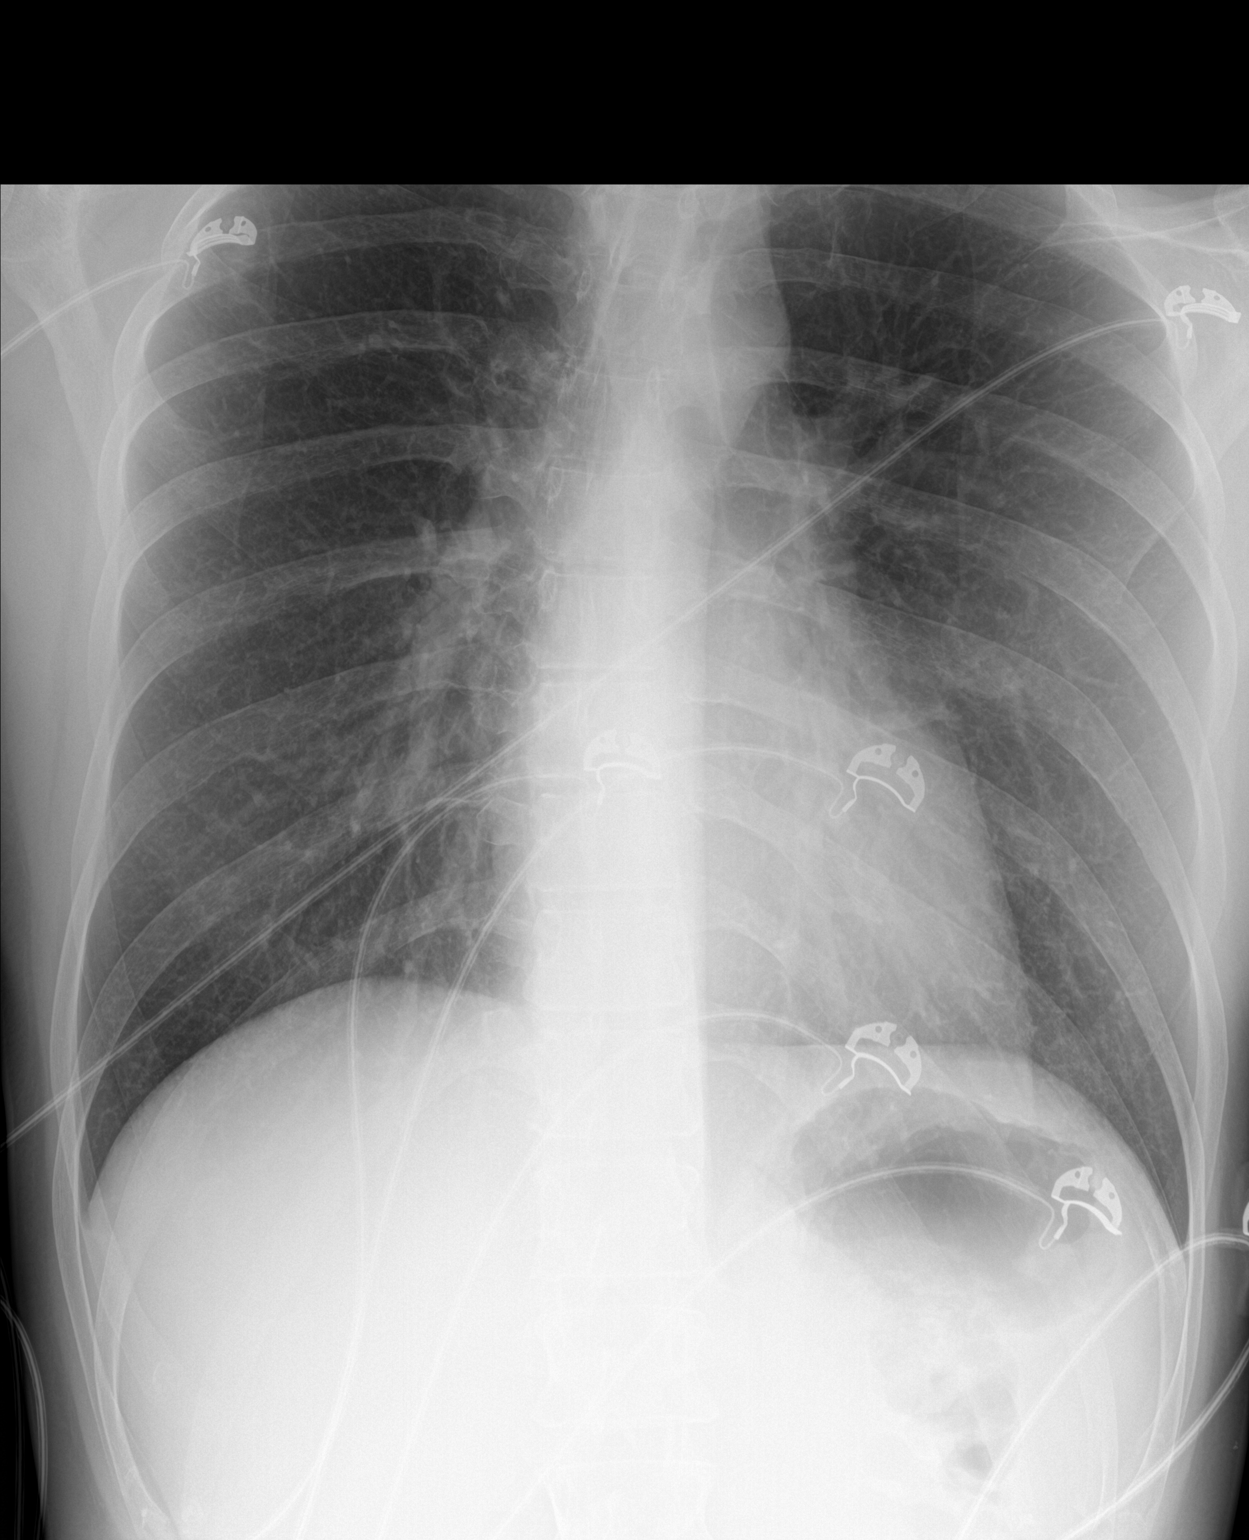

[1 of 1 positions shown; findings below may reference images not displayed]

FINDINGS: The cardiomediastinal silhouette is normal in contour. No pleural
effusion. No pneumothorax. No acute pleuroparenchymal abnormality.
Unchanged apical pleural thickening. Visualized abdomen is
unremarkable. No acute osseous abnormality noted.
IMPRESSION: No acute cardiopulmonary abnormality.

## 2021-09-21 IMAGING — MR MR HEAD WO/W CM
10 of 11 series · 46 of 48 positions shown · IV contrast (gadavist)
Comparison: None.

CLINICAL DATA: Blurry vision and headache

EXAM:
MRI HEAD WITHOUT AND WITH CONTRAST
TECHNIQUE: Multiplanar, multiecho pulse sequences of the brain and surrounding
structures were obtained without and with intravenous contrast.
CONTRAST:  7mL GADAVIST GADOBUTROL 1 MMOL/ML IV SOLN

[Series 2: T1 · sagittal · 5.0mm · 0.90mm/px · 2 of 27 slices shown (1 of 2)]
[im 1/27]
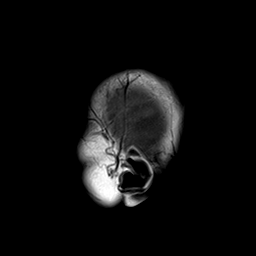
[im 27/27]
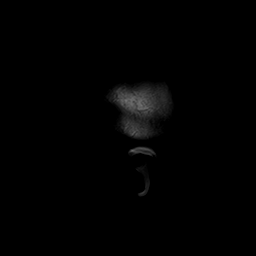

[Series 3: DWI · axial · 3.0mm · 1.88mm/px · z∈[-59,+109]mm · 9 of 103 slices shown (1 of 2)]
[im 1/103]
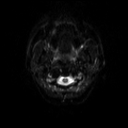
[im 13/103]
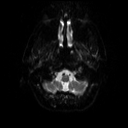
[im 26/103]
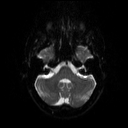
[im 39/103]
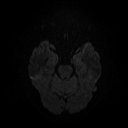
[im 52/103]
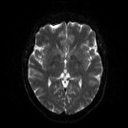
[im 64/103]
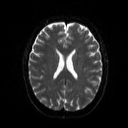
[im 77/103]
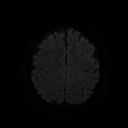
[im 90/103]
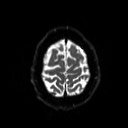
[im 103/103]
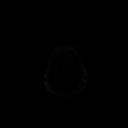

[Series 4: DWI · axial · 3.0mm · 1.88mm/px · z∈[-59,+109]mm · 4 of 52 slices shown (2 of 2)]
[im 1/52]
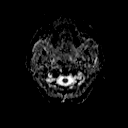
[im 18/52]
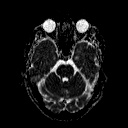
[im 35/52]
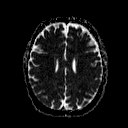
[im 52/52]
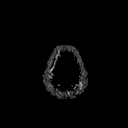

[Series 5: T2 · axial · 5.0mm · 0.72mm/px · z∈[-56,+106]mm · 2 of 28 slices shown (1 of 3)]
[im 1/28]
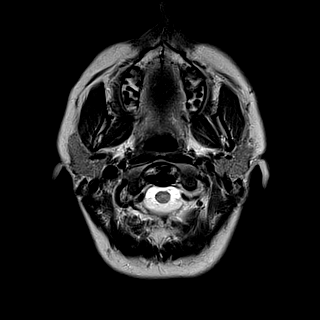
[im 28/28]
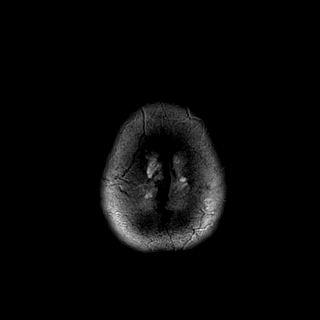

[Series 6: T2 · axial · 5.0mm · 0.45mm/px · z∈[-56,+106]mm · 2 of 28 slices shown (2 of 3)]
[im 1/28]
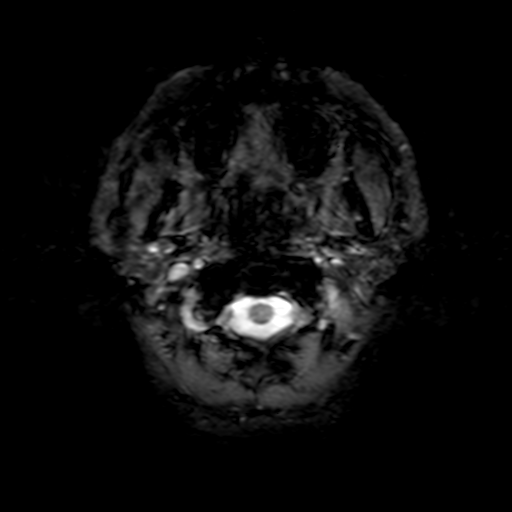
[im 28/28]
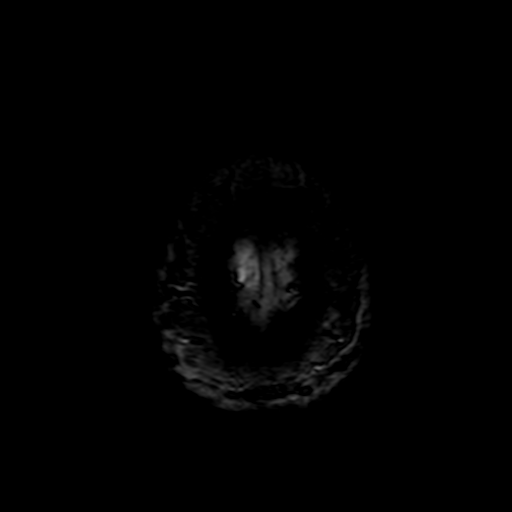

[Series 7: FLAIR · axial · 3.0mm · 0.45mm/px · z∈[-57,+106]mm · 3 of 42 slices shown]
[im 1/42]
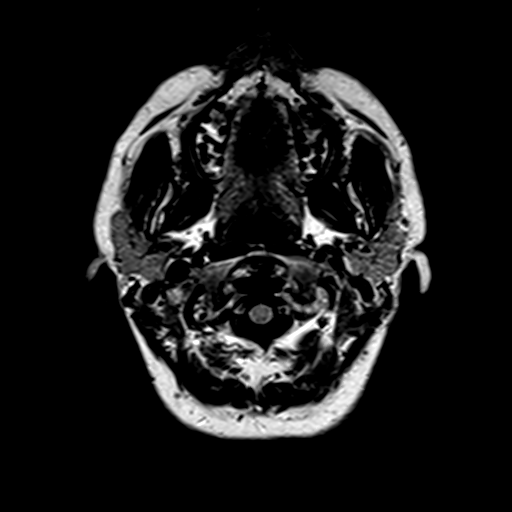
[im 21/42]
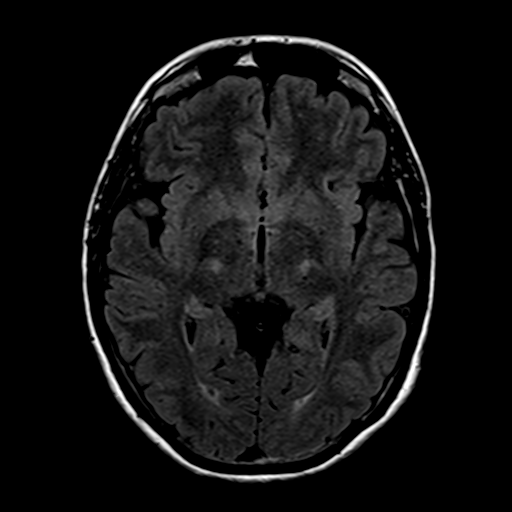
[im 42/42]
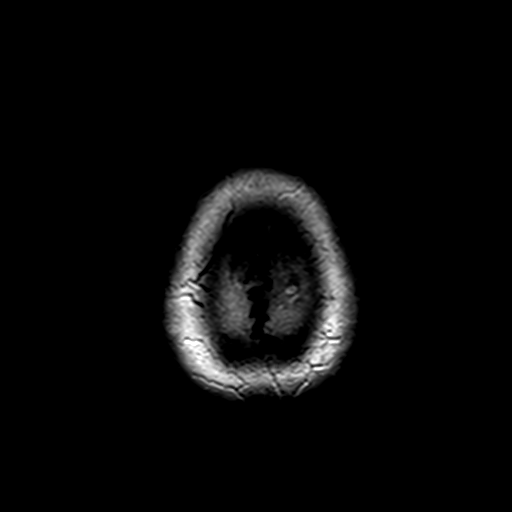

[Series 8: t1_3d_tra · axial · 2.0mm · 0.94mm/px · z∈[-75,+131]mm · 9 of 104 slices shown]
[im 1/104]
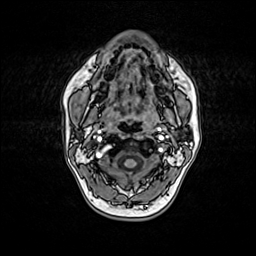
[im 13/104]
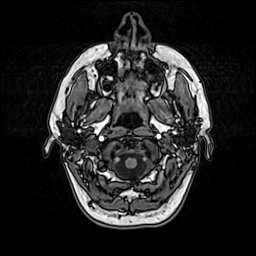
[im 26/104]
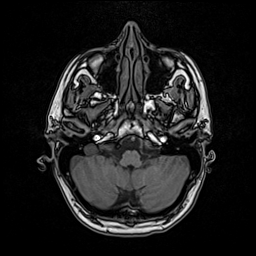
[im 39/104]
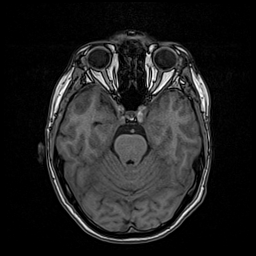
[im 52/104]
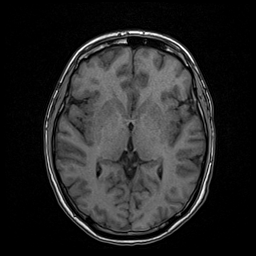
[im 65/104]
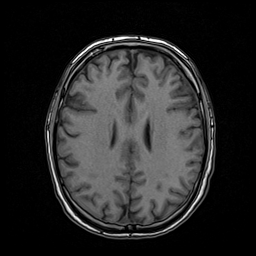
[im 78/104]
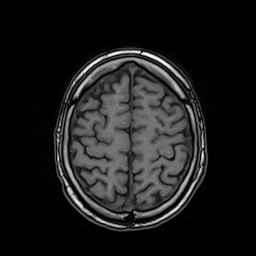
[im 91/104]
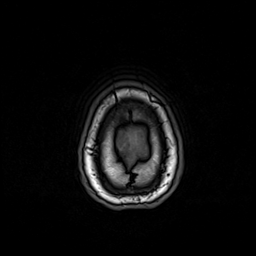
[im 104/104]
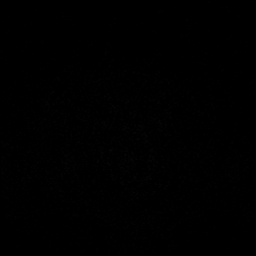

[Series 9: T2 · coronal · 5.0mm · 0.69mm/px · 3 of 32 slices shown (3 of 3)]
[im 1/32]
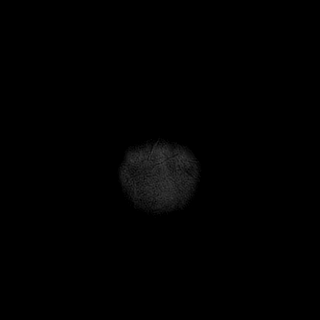
[im 16/32]
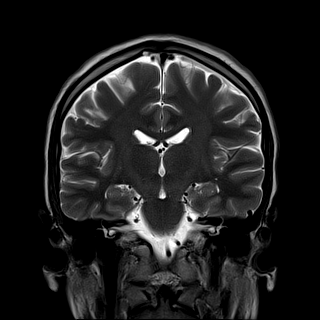
[im 32/32]
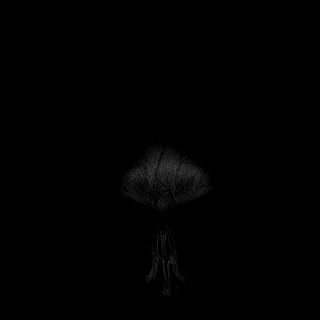

[Series 10: t1_3d_tra +c · axial · 2.0mm · 0.94mm/px · z∈[-75,+131]mm · 9 of 104 slices shown]
[im 1/104]
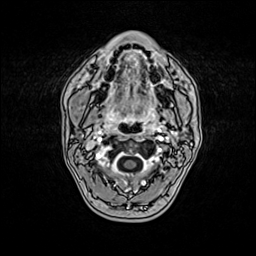
[im 13/104]
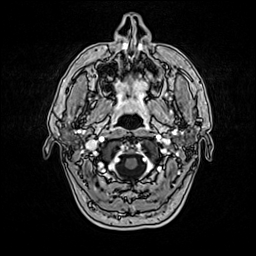
[im 26/104]
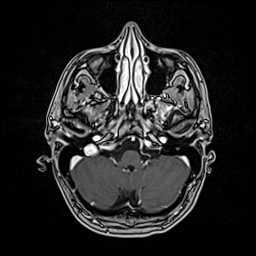
[im 39/104]
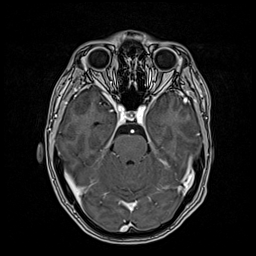
[im 52/104]
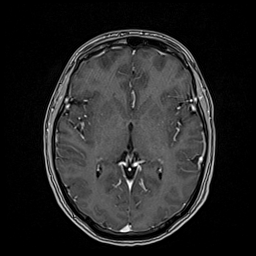
[im 65/104]
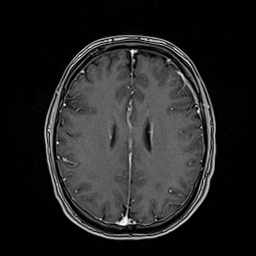
[im 78/104]
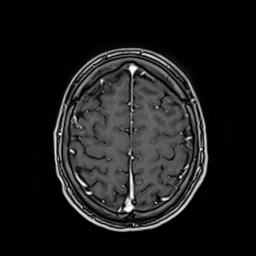
[im 91/104]
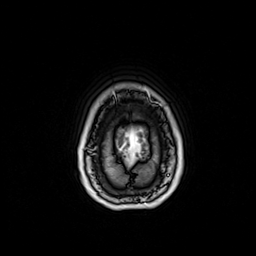
[im 104/104]
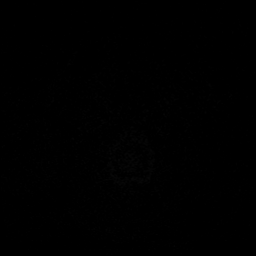

[Series 11: T1 · coronal · 5.0mm · 0.86mm/px · 3 of 32 slices shown (2 of 2)]
[im 1/32]
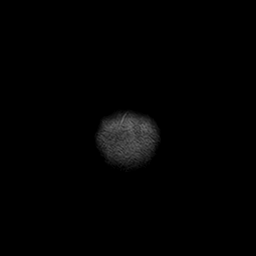
[im 16/32]
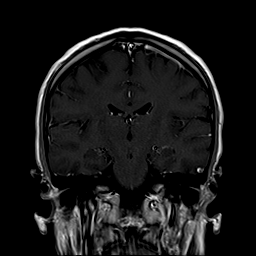
[im 32/32]
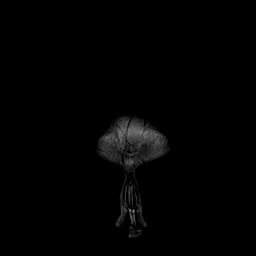

[46 of 48 positions shown; findings below may reference images not displayed]

FINDINGS: Brain: No acute infarct, acute hemorrhage or extra-axial collection.
Normal white matter signal. Normal volume of CSF spaces. No chronic
microhemorrhage. Normal midline structures. There is no abnormal
contrast enhancement.

Vascular: Normal flow voids.

Skull and upper cervical spine: Normal marrow signal.

Sinuses/Orbits: Negative.

Other: None.
IMPRESSION: Normal brain MRI.

## 2023-07-30 ENCOUNTER — Ambulatory Visit
Admission: RE | Admit: 2023-07-30 | Discharge: 2023-07-30 | Disposition: A | Discharge status: Home / Self Care | Payer: Self-pay | Source: Ambulatory Visit | Attending: Family Medicine | Admitting: Family Medicine

## 2023-07-30 VITALS — BP 135/75 | HR 74 | Temp 98.3°F | Resp 18 | Ht 72.0 in | Wt 183.0 lb

## 2023-07-30 DIAGNOSIS — R0789 Other chest pain: Secondary | ICD-10-CM

## 2023-07-30 NOTE — ED Triage Notes (Signed)
 Patient c/o generalized chest discomfort for a couple of days.  Denies any SOB.  States he does have weakness in his legs occasionally.

## 2023-07-30 NOTE — ED Provider Notes (Signed)
 [facility]  Note:  This document was prepared using Conservation officer, historic buildings and may include unintentional dictation errors.  MRN: [id number] DOB: 1985-11-25  Subjective:   James Vincent is a 38 y.o. male presenting for 1 day history of intermittent transient mid to left-sided chest pain.  No fever, shortness of breath, heart racing, palpitations, nausea, vomiting, abdominal pain, cough, respiratory symptoms, rashes.  No history of asthma.  No history of heart disease, MI.  No history of diabetes.  No smoking.  Patient reports that he has had elevated cholesterol in the past but does not take anything for this.  He also reports chronic intermittent weakness and fatigue of his lower legs.  He has previously been worked up for this with negative results.  Has not followed up with his PCP.    No current facility-administered medications for this encounter.  Current Outpatient Medications:    VITAMIN D PO, Take by mouth., Disp: , Rfl:    No Known Allergies  History reviewed. No pertinent past medical history.   History reviewed. No pertinent surgical history.  Family History  Problem Relation Age of Onset   Healthy Mother    Healthy Father     Social History   Tobacco Use   Smoking status: Never   Smokeless tobacco: Never  Vaping Use   Vaping status: Never Used  Substance Use Topics   Alcohol use: Not Currently   Drug use: Never    ROS   Objective:   Vitals: BP 135/75 (BP Location: Left Arm)   Pulse 74   Temp 98.3 F (36.8 C) (Oral)   Resp 18   Ht 6' (1.829 m)   Wt 182 lb 15.7 oz (83 kg)   SpO2 98%   BMI 24.82 kg/m   Physical Exam Constitutional:      General: He is not in acute distress.    Appearance: Normal appearance. He is well-developed. He is not ill-appearing, toxic-appearing or diaphoretic.  HENT:     Head: Normocephalic and atraumatic.     Right Ear: External ear normal.     Left Ear: External ear normal.      Nose: Nose normal.     Mouth/Throat:     Mouth: Mucous membranes are moist.  Eyes:     General: No scleral icterus.       Right eye: No discharge.        Left eye: No discharge.     Extraocular Movements: Extraocular movements intact.  Cardiovascular:     Rate and Rhythm: Normal rate and regular rhythm.     Heart sounds: Normal heart sounds. No murmur heard.    No friction rub. No gallop.  Pulmonary:     Effort: Pulmonary effort is normal. No respiratory distress.     Breath sounds: Normal breath sounds. No stridor. No wheezing, rhonchi or rales.  Chest:     Chest wall: No tenderness.  Musculoskeletal:        General: No swelling or tenderness. Normal range of motion.     Comments: Ambulates without any assistance and expected pace.  Neurological:     Mental Status: He is alert and oriented to person, place, and time.     Motor: No weakness.     Coordination: Coordination normal.     Gait: Gait normal.  Psychiatric:        Mood and Affect: Mood normal.        Behavior: Behavior normal.  Thought Content: Thought content normal.     ED ECG REPORT   Date: 07/30/2023  EKG Time: 10:45 AM  Rate: 76bpm  Rhythm: normal sinus rhythm,  unchanged from previous tracings  Axis: normal  Intervals:none  ST&T Change: None  Narrative Interpretation: Sinus rhythm at 76 bpm, no acute findings or changes.   Assessment and Plan :   PDMP not reviewed this encounter.  1. Atypical chest pain    Patient has minimal cardiac risk factors.  Currently he is asymptomatic.  His primary concern was to review whether or not he was having a heart event.  I reassured patient.  Recommended very close follow-up with his PCP for recheck and general screening, continued workup for his chronic fatigue and lower leg weakness/heaviness. Counseled patient on potential for adverse effects with medications prescribed/recommended today, ER and return-to-clinic precautions discussed, patient verbalized  understanding.    Adolph Hoop, PA-C 07/30/23 1046

## 2023-12-12 ENCOUNTER — Ambulatory Visit
